# Patient Record
Sex: Female | Born: 1985 | Race: White | Hispanic: No | Marital: Married | Smoking: Never smoker
Health system: Southern US, Community
[De-identification: ages and names within clinical notes are randomized; demographics above are authoritative.]

## PROBLEM LIST (undated history)

## (undated) ENCOUNTER — Inpatient Hospital Stay (HOSPITAL_COMMUNITY): Payer: Self-pay

## (undated) DIAGNOSIS — Z789 Other specified health status: Secondary | ICD-10-CM

## (undated) HISTORY — PX: APPENDECTOMY: SHX54

---

## 2009-05-23 ENCOUNTER — Other Ambulatory Visit: Payer: Self-pay | Admitting: Emergency Medicine

## 2009-05-24 ENCOUNTER — Inpatient Hospital Stay (HOSPITAL_COMMUNITY): Admission: AD | Admit: 2009-05-24 | Discharge: 2009-05-24 | Payer: Self-pay | Admitting: Obstetrics & Gynecology

## 2009-05-24 ENCOUNTER — Other Ambulatory Visit: Payer: Self-pay | Admitting: Emergency Medicine

## 2009-05-26 ENCOUNTER — Ambulatory Visit (HOSPITAL_COMMUNITY): Admission: RE | Admit: 2009-05-26 | Discharge: 2009-05-26 | Payer: Self-pay | Admitting: Obstetrics & Gynecology

## 2009-07-01 ENCOUNTER — Emergency Department (HOSPITAL_BASED_OUTPATIENT_CLINIC_OR_DEPARTMENT_OTHER): Admission: EM | Admit: 2009-07-01 | Discharge: 2009-07-01 | Payer: Self-pay | Admitting: Emergency Medicine

## 2009-08-17 ENCOUNTER — Other Ambulatory Visit: Payer: Self-pay | Admitting: Emergency Medicine

## 2009-08-18 ENCOUNTER — Inpatient Hospital Stay (HOSPITAL_COMMUNITY): Admission: AD | Admit: 2009-08-18 | Discharge: 2009-08-18 | Payer: Self-pay | Admitting: Obstetrics & Gynecology

## 2009-08-18 ENCOUNTER — Other Ambulatory Visit: Payer: Self-pay | Admitting: Emergency Medicine

## 2010-12-06 LAB — CULTURE, ROUTINE-GENITAL: Culture: NORMAL

## 2010-12-06 LAB — DIFFERENTIAL
Lymphocytes Relative: 18 % (ref 12–46)
Monocytes Absolute: 0.9 10*3/uL (ref 0.1–1.0)
Monocytes Relative: 7 % (ref 3–12)
Neutro Abs: 8.8 10*3/uL — ABNORMAL HIGH (ref 1.7–7.7)

## 2010-12-06 LAB — BASIC METABOLIC PANEL
CO2: 26 mEq/L (ref 19–32)
Calcium: 9.5 mg/dL (ref 8.4–10.5)
GFR calc Af Amer: >60 mL/min (ref >60)
GFR calc non Af Amer: >60 mL/min (ref >60)
Sodium: 147 mEq/L — ABNORMAL HIGH (ref 135–145)

## 2010-12-06 LAB — CBC
Hemoglobin: 12.2 g/dL (ref 12.0–15.0)
MCHC: 35 g/dL (ref 30.0–36.0)
RBC: 4.17 MIL/uL (ref 3.87–5.11)

## 2010-12-06 LAB — GRAM STAIN: Gram Stain: NONE SEEN

## 2010-12-08 LAB — URINALYSIS, ROUTINE W REFLEX MICROSCOPIC
Bilirubin Urine: NEGATIVE
Hgb urine dipstick: NEGATIVE
Specific Gravity, Urine: 1.013 (ref 1.005–1.030)
pH: 6 (ref 5.0–8.0)

## 2010-12-09 LAB — CBC
HCT: 35.6 % — ABNORMAL LOW (ref 36.0–46.0)
MCHC: 35 g/dL (ref 30.0–36.0)
Platelets: 393 10*3/uL (ref 150–400)
RDW: 13.4 % (ref 11.5–15.5)

## 2010-12-09 LAB — HCG, QUANTITATIVE, PREGNANCY
hCG, Beta Chain, Quant, S: 273 m[IU]/mL — ABNORMAL HIGH (ref <5)
hCG, Beta Chain, Quant, S: 68 m[IU]/mL — ABNORMAL HIGH (ref <5)

## 2010-12-09 LAB — URINALYSIS, ROUTINE W REFLEX MICROSCOPIC
Glucose, UA: NEGATIVE mg/dL
Nitrite: NEGATIVE
Protein, ur: NEGATIVE mg/dL
pH: 7 (ref 5.0–8.0)

## 2010-12-09 LAB — URINE MICROSCOPIC-ADD ON

## 2010-12-09 LAB — GC/CHLAMYDIA PROBE AMP, GENITAL: Chlamydia, DNA Probe: NEGATIVE

## 2010-12-09 LAB — DIFFERENTIAL
Basophils Absolute: 0.1 10*3/uL (ref 0.0–0.1)
Basophils Relative: 1 % (ref 0–1)
Eosinophils Absolute: 0 10*3/uL (ref 0.0–0.7)
Eosinophils Relative: 0 % (ref 0–5)
Lymphocytes Relative: 16 % (ref 12–46)

## 2010-12-09 LAB — WET PREP, GENITAL: Trich, Wet Prep: NONE SEEN

## 2012-05-02 ENCOUNTER — Inpatient Hospital Stay (HOSPITAL_COMMUNITY)
Admission: AD | Admit: 2012-05-02 | Discharge: 2012-05-02 | Disposition: A | Discharge status: Hospice | Payer: Federal, State, Local not specified - PPO | Source: Ambulatory Visit | Attending: Obstetrics and Gynecology | Admitting: Obstetrics and Gynecology

## 2012-05-02 ENCOUNTER — Encounter (HOSPITAL_COMMUNITY): Payer: Self-pay

## 2012-05-02 DIAGNOSIS — O210 Mild hyperemesis gravidarum: Secondary | ICD-10-CM | POA: Insufficient documentation

## 2012-05-02 DIAGNOSIS — O219 Vomiting of pregnancy, unspecified: Secondary | ICD-10-CM

## 2012-05-02 HISTORY — DX: Other specified health status: Z78.9

## 2012-05-02 LAB — URINE MICROSCOPIC-ADD ON

## 2012-05-02 LAB — URINALYSIS, ROUTINE W REFLEX MICROSCOPIC
Glucose, UA: NEGATIVE mg/dL
Specific Gravity, Urine: >1.03 — ABNORMAL HIGH (ref 1.005–1.030)
pH: 6 (ref 5.0–8.0)

## 2012-05-02 MED ORDER — METOCLOPRAMIDE HCL 10 MG PO TABS: 10.0000 mg | ORAL_TABLET | Freq: Four times a day (QID) | ORAL | Status: DC

## 2012-05-02 MED ORDER — RANITIDINE HCL 150 MG PO TABS: 150.0000 mg | ORAL_TABLET | Freq: Two times a day (BID) | ORAL | Status: DC

## 2012-05-02 MED ORDER — DEXTROSE 5 % IN LACTATED RINGERS IV BOLUS
1000.0000 mL | Freq: Once | INTRAVENOUS | Status: AC
  Administered 2012-05-02: 1000 mL via INTRAVENOUS

## 2012-05-02 MED ORDER — ONDANSETRON HCL 8 MG PO TABS: 4.0000 mg | ORAL_TABLET | Freq: Three times a day (TID) | ORAL | Status: AC | PRN

## 2012-05-02 MED ORDER — ONDANSETRON HCL 4 MG/2ML IJ SOLN
4.0000 mg | Freq: Once | INTRAMUSCULAR | Status: AC
  Administered 2012-05-02: 4 mg via INTRAVENOUS
  Filled 2012-05-02: qty 2

## 2012-05-02 MED ORDER — FAMOTIDINE IN NACL 20-0.9 MG/50ML-% IV SOLN
20.0000 mg | Freq: Once | INTRAVENOUS | Status: AC
Start: 2012-05-02 — End: 2012-05-02
  Administered 2012-05-02: 20 mg via INTRAVENOUS
  Filled 2012-05-02: qty 50

## 2012-05-02 NOTE — MAU Provider Note (Signed)
History     CSN: [id number]  Arrival date and time: 05/02/12 1641   None     Chief Complaint  Patient presents with  . Morning Sickness   HPI 26 y.o. [id number] at [redacted]w[redacted]d with ongoing nausea and vomiting, worse x 2 days, taking Zofran ODT and Phenergan at home, but taste of Zofran makes her sick and can't keep down phenergan.  Past Medical History  Diagnosis Date  . No pertinent past medical history     Past Surgical History  Procedure Date  . Cesarean section     Family History  Problem Relation Age of Onset  . Other Neg Hx     History  Substance Use Topics  . Smoking status: Never Smoker   . Smokeless tobacco: Not on file  . Alcohol Use: No    Allergies: No Known Allergies  Prescriptions prior to admission  Medication Sig Dispense Refill  . calcium carbonate (TUMS - DOSED IN MG ELEMENTAL CALCIUM) 500 MG chewable tablet Chew 2 tablets by mouth as needed. For heartburn      . docusate sodium (COLACE) 100 MG capsule Take 100 mg by mouth 2 (two) times daily as needed. For constipation      . flintstones complete (FLINTSTONES) 60 MG chewable tablet Chew 2 tablets by mouth daily.      . nitrofurantoin, macrocrystal-monohydrate, (MACROBID) 100 MG capsule Take 100 mg by mouth 2 (two) times daily. For 7 days starting 04/30/12      . ondansetron (ZOFRAN-ODT) 8 MG disintegrating tablet Take 8 mg by mouth every 8 (eight) hours as needed. For nausea      . promethazine (PHENERGAN) 25 MG tablet Take 25 mg by mouth every 6 (six) hours as needed. For nausea        Review of Systems  Constitutional: Negative.   Respiratory: Negative.   Cardiovascular: Negative.   Gastrointestinal: Positive for heartburn, nausea and vomiting. Negative for abdominal pain, diarrhea and constipation.  Genitourinary: Negative for dysuria, urgency, frequency, hematuria and flank pain.       Negative for vaginal bleeding, vaginal discharge, dyspareunia  Musculoskeletal: Negative.   Neurological:  Negative.   Psychiatric/Behavioral: Negative.    Physical Exam   Blood pressure 136/67, pulse 104, temperature 98.4 F (36.9 C), temperature source Oral, resp. rate 16, height 5\' 1"  (1.549 m), weight 173 lb 3.2 oz (78.563 kg), SpO2 100.00%.  Physical Exam  Nursing note and vitals reviewed. Constitutional: She is oriented to person, place, and time. She appears well-developed and well-nourished. No distress.  Cardiovascular: Normal rate.   Respiratory: Effort normal.  Musculoskeletal: Normal range of motion.  Neurological: She is alert and oriented to person, place, and time.  Skin: Skin is warm and dry.  Psychiatric: She has a normal mood and affect.    MAU Course  Procedures  Results for orders placed during the hospital encounter of 05/02/12 (from the past 24 hour(s))  URINALYSIS, ROUTINE W REFLEX MICROSCOPIC     Status: Abnormal   Collection Time   05/02/12  4:59 PM      Component Value Range   Color, Urine YELLOW  YELLOW   APPearance CLOUDY (*) CLEAR   Specific Gravity, Urine >1.030 (*) 1.005 - 1.030   pH 6.0  5.0 - 8.0   Glucose, UA NEGATIVE  NEGATIVE mg/dL   Hgb urine dipstick SMALL (*) NEGATIVE   Bilirubin Urine SMALL (*) NEGATIVE   Ketones, ur 40 (*) NEGATIVE mg/dL   Protein, ur NEGATIVE  NEGATIVE  mg/dL   Urobilinogen, UA 0.2  0.0 - 1.0 mg/dL   Nitrite NEGATIVE  NEGATIVE   Leukocytes, UA MODERATE (*) NEGATIVE  URINE MICROSCOPIC-ADD ON     Status: Abnormal   Collection Time   05/02/12  4:59 PM      Component Value Range   Squamous Epithelial / LPF MANY (*) RARE   WBC, UA 21-50  <3 WBC/hpf   RBC / HPF 0-2  <3 RBC/hpf   Bacteria, UA FEW (*) RARE   Urine-Other MUCOUS PRESENT     IV hydration, Zofran 4 mg IV and Pepcid 20 mg IV  Assessment and Plan  26 y.o. [id number] at [redacted]w[redacted]d 1. Pregnancy related nausea and vomiting, antepartum    Medication List  As of 05/04/2012  2:43 PM   START taking these medications         metoCLOPramide 10 MG tablet   Commonly known as:  REGLAN   Take 1 tablet (10 mg total) by mouth 4 (four) times daily.      ondansetron 8 MG tablet   Commonly known as: ZOFRAN   Take 0.5-1 tablets (4-8 mg total) by mouth every 8 (eight) hours as needed for nausea.      ranitidine 150 MG tablet   Commonly known as: ZANTAC   Take 1 tablet (150 mg total) by mouth 2 (two) times daily.         CONTINUE taking these medications         calcium carbonate 500 MG chewable tablet   Commonly known as: TUMS - dosed in mg elemental calcium      docusate sodium 100 MG capsule   Commonly known as: COLACE      flintstones complete 60 MG chewable tablet      nitrofurantoin (macrocrystal-monohydrate) 100 MG capsule   Commonly known as: MACROBID      promethazine 25 MG tablet   Commonly known as: PHENERGAN         STOP taking these medications         ondansetron 8 MG disintegrating tablet          Where to get your medications    These are the prescriptions that you need to pick up. We sent them to a specific pharmacy, so you will need to go there to get them.   [id number] - [place], [place] - [address]    [address] [place] [address]    Phone: [phone]        metoCLOPramide 10 MG tablet   ondansetron 8 MG tablet   ranitidine 150 MG tablet              Kelsey Rogers 05/02/2012, 6:03 PM

## 2012-05-02 NOTE — MAU Note (Signed)
Patient states she has had nausea for a while but has not kept anything down since yesterday. Has slight back pain with vomiting, no bleeding.

## 2012-05-07 LAB — OB RESULTS CONSOLE GC/CHLAMYDIA: Chlamydia: NEGATIVE

## 2012-05-10 ENCOUNTER — Inpatient Hospital Stay (HOSPITAL_COMMUNITY)
Admission: AD | Admit: 2012-05-10 | Discharge: 2012-05-10 | Disposition: A | Discharge status: Home / Self Care | Payer: Federal, State, Local not specified - PPO | Source: Ambulatory Visit | Attending: Obstetrics and Gynecology | Admitting: Obstetrics and Gynecology

## 2012-05-10 ENCOUNTER — Encounter (HOSPITAL_COMMUNITY): Payer: Self-pay | Admitting: *Deleted

## 2012-05-10 DIAGNOSIS — R12 Heartburn: Secondary | ICD-10-CM | POA: Insufficient documentation

## 2012-05-10 DIAGNOSIS — E860 Dehydration: Secondary | ICD-10-CM | POA: Insufficient documentation

## 2012-05-10 DIAGNOSIS — O211 Hyperemesis gravidarum with metabolic disturbance: Secondary | ICD-10-CM

## 2012-05-10 LAB — URINALYSIS, ROUTINE W REFLEX MICROSCOPIC
Glucose, UA: NEGATIVE mg/dL
Ketones, ur: 40 mg/dL — AB
Nitrite: NEGATIVE
Protein, ur: 30 mg/dL — AB
Urobilinogen, UA: 0.2 mg/dL (ref 0.0–1.0)

## 2012-05-10 MED ORDER — FAMOTIDINE IN NACL 20-0.9 MG/50ML-% IV SOLN
20.0000 mg | Freq: Once | INTRAVENOUS | Status: AC
  Administered 2012-05-10: 20 mg via INTRAVENOUS
  Filled 2012-05-10: qty 50

## 2012-05-10 MED ORDER — OMEPRAZOLE 20 MG PO CPDR: 20.0000 mg | DELAYED_RELEASE_CAPSULE | Freq: Every day | ORAL | Status: DC

## 2012-05-10 MED ORDER — LACTATED RINGERS IV BOLUS (SEPSIS)
1000.0000 mL | Freq: Once | INTRAVENOUS | Status: AC
  Administered 2012-05-10: 1000 mL via INTRAVENOUS

## 2012-05-10 MED ORDER — ONDANSETRON HCL 4 MG/2ML IJ SOLN
4.0000 mg | Freq: Once | INTRAMUSCULAR | Status: AC
  Administered 2012-05-10: 4 mg via INTRAVENOUS
  Filled 2012-05-10: qty 2

## 2012-05-10 NOTE — MAU Note (Signed)
Pt states she feels like her nausea medicine is not working and that she needs fluids

## 2012-05-10 NOTE — MAU Provider Note (Signed)
Chief Complaint: Hyperemesis Gravidarum  Initial contact at 1920   SUBJECTIVE HPI: Kelsey Rogers is a 26 y.o. [phone] at [redacted]w[redacted]d by LMP who presents to MAU reporting intractable vomiting unable to retain food or po meds for over 24 hrs. Vomited x10 today and now nauseated and retching. Also with "raw" throat and heartburn. Has had Reglan, Zofran, Phenergan and got most relief from IV Zofran at MAU visit last week. No fever or urinary complaints.   Past Medical History  Diagnosis Date  . No pertinent past medical history    OB History    Grav Para Term Preterm Abortions TAB SAB Ect Mult Living   4 1 0 1 2 0 2 0 0 1      # Outc Date GA Lbr Len/2nd Wgt Sex Del Anes PTL Lv   1 PRE     M LTCS   Yes   Comments: fetal distress   2 SAB            3 SAB            4 CUR              Past Surgical History  Procedure Date  . Cesarean section    History   Social History  . Marital Status: Married    Spouse Name: N/A    Number of Children: N/A  . Years of Education: N/A   Occupational History  . Not on file.   Social History Main Topics  . Smoking status: Never Smoker   . Smokeless tobacco: Not on file  . Alcohol Use: No  . Drug Use: No  . Sexually Active: Yes   Other Topics Concern  . Not on file   Social History Narrative  . No narrative on file   No current facility-administered medications on file prior to encounter.   Current Outpatient Prescriptions on File Prior to Encounter  Medication Sig Dispense Refill  . docusate sodium (COLACE) 100 MG capsule Take 100 mg by mouth 2 (two) times daily as needed. For constipation      . flintstones complete (FLINTSTONES) 60 MG chewable tablet Chew 2 tablets by mouth daily.      . metoCLOPramide (REGLAN) 10 MG tablet Take 1 tablet (10 mg total) by mouth 4 (four) times daily.  60 tablet  1  . promethazine (PHENERGAN) 25 MG tablet Take 25 mg by mouth every 6 (six) hours as needed. For nausea      . ranitidine (ZANTAC) 150 MG tablet  Take 1 tablet (150 mg total) by mouth 2 (two) times daily.  60 tablet  1  . ondansetron (ZOFRAN) 8 MG tablet Take 0.5-1 tablets (4-8 mg total) by mouth every 8 (eight) hours as needed for nausea.  20 tablet  1   No Known Allergies  ROS: Pertinent items in HPI  OBJECTIVE Blood pressure 117/74, pulse 93, temperature 97.9 F (36.6 C), temperature source Oral, resp. rate 20, height 5\' 1"  (1.549 m), weight 78.019 kg (172 lb), SpO2 100.00%.   Weight in MAU 1 wk ago 172  GENERAL: Well-developed, well-nourished female in mild distress.  HEENT: Normocephalic HEART: normal rate RESP: normal effort ABDOMEN: Soft, non-tender EXTREMITIES: Nontender, no edema NEURO: Alert and oriented  LAB RESULTS Results for orders placed during the hospital encounter of 05/10/12 (from the past 24 hour(s))  URINALYSIS, ROUTINE W REFLEX MICROSCOPIC     Status: Abnormal   Collection Time   05/10/12  6:45 PM  Component Value Range   Color, Urine YELLOW  YELLOW   APPearance CLOUDY (*) CLEAR   Specific Gravity, Urine >1.030 (*) 1.005 - 1.030   pH 6.5  5.0 - 8.0   Glucose, UA NEGATIVE  NEGATIVE mg/dL   Hgb urine dipstick LARGE (*) NEGATIVE   Bilirubin Urine NEGATIVE  NEGATIVE   Ketones, ur 40 (*) NEGATIVE mg/dL   Protein, ur 30 (*) NEGATIVE mg/dL   Urobilinogen, UA 0.2  0.0 - 1.0 mg/dL   Nitrite NEGATIVE  NEGATIVE   Leukocytes, UA LARGE (*) NEGATIVE  URINE MICROSCOPIC-ADD ON     Status: Abnormal   Collection Time   05/10/12  6:45 PM      Component Value Range   Squamous Epithelial / LPF MANY (*) RARE   WBC, UA TOO NUMEROUS TO COUNT  <3 WBC/hpf   RBC / HPF 7-10  <3 RBC/hpf   Urine-Other MUCOUS PRESENT       MAU COURSE Rehydration with LR 1000, Zofran 4mg  IV, Pepcid 20mg  IV  ASSESSMENT 1. Hyperemesis gravidarum with dehydration   2. Pyrosis   [id number] at [redacted]w[redacted]d  PLAN Feeling much better after IVF with Zofran and Pepcid.  Will go home.   Taking zantac but not helping with heartburn.  Will try  Prilosec (stop zantac) and BRAT diet also. Medication List  As of 05/10/2012  7:24 PM   ASK your doctor about these medications         docusate sodium 100 MG capsule   Commonly known as: COLACE   Take 100 mg by mouth 2 (two) times daily as needed. For constipation      flintstones complete 60 MG chewable tablet   Chew 2 tablets by mouth daily.      metoCLOPramide 10 MG tablet   Commonly known as: REGLAN   Take 1 tablet (10 mg total) by mouth 4 (four) times daily.      ondansetron 8 MG tablet   Commonly known as: ZOFRAN   Take 8 mg by mouth every 8 (eight) hours as needed. For nausea      promethazine 25 MG tablet   Commonly known as: PHENERGAN   Take 25 mg by mouth every 6 (six) hours as needed. For nausea      ranitidine 150 MG tablet   Commonly known as: ZANTAC   Take 1 tablet (150 mg total) by mouth 2 (two) times daily.             Danae Orleans, CNM 05/10/2012  7:24 PM  Care assumed by Lilyan Punt, NP at [facility]

## 2012-05-22 ENCOUNTER — Encounter (HOSPITAL_COMMUNITY): Payer: Self-pay | Admitting: Family

## 2012-05-22 ENCOUNTER — Inpatient Hospital Stay (HOSPITAL_COMMUNITY)
Admission: AD | Admit: 2012-05-22 | Discharge: 2012-05-22 | Disposition: A | Discharge status: Home / Self Care | Payer: Federal, State, Local not specified - PPO | Source: Ambulatory Visit | Attending: Obstetrics and Gynecology | Admitting: Obstetrics and Gynecology

## 2012-05-22 DIAGNOSIS — O219 Vomiting of pregnancy, unspecified: Secondary | ICD-10-CM

## 2012-05-22 DIAGNOSIS — O99891 Other specified diseases and conditions complicating pregnancy: Secondary | ICD-10-CM | POA: Insufficient documentation

## 2012-05-22 DIAGNOSIS — K2289 Other specified disease of esophagus: Secondary | ICD-10-CM

## 2012-05-22 DIAGNOSIS — K228 Other specified diseases of esophagus: Secondary | ICD-10-CM

## 2012-05-22 DIAGNOSIS — O210 Mild hyperemesis gravidarum: Secondary | ICD-10-CM | POA: Insufficient documentation

## 2012-05-22 DIAGNOSIS — R51 Headache: Secondary | ICD-10-CM | POA: Insufficient documentation

## 2012-05-22 DIAGNOSIS — R109 Unspecified abdominal pain: Secondary | ICD-10-CM | POA: Insufficient documentation

## 2012-05-22 LAB — URINALYSIS, ROUTINE W REFLEX MICROSCOPIC
Glucose, UA: NEGATIVE mg/dL
Hgb urine dipstick: NEGATIVE
Specific Gravity, Urine: 1.025 (ref 1.005–1.030)
Urobilinogen, UA: 2 mg/dL — ABNORMAL HIGH (ref 0.0–1.0)
pH: 6 (ref 5.0–8.0)

## 2012-05-22 LAB — URINE MICROSCOPIC-ADD ON

## 2012-05-22 MED ORDER — ONDANSETRON HCL 4 MG/2ML IJ SOLN
4.0000 mg | Freq: Once | INTRAMUSCULAR | Status: AC
  Administered 2012-05-22: 4 mg via INTRAVENOUS
  Filled 2012-05-22: qty 2

## 2012-05-22 MED ORDER — LACTATED RINGERS IV BOLUS (SEPSIS)
1000.0000 mL | Freq: Once | INTRAVENOUS | Status: AC
  Administered 2012-05-22: 1000 mL via INTRAVENOUS

## 2012-05-22 MED ORDER — PROMETHAZINE HCL 25 MG/ML IJ SOLN
25.0000 mg | Freq: Once | INTRAVENOUS | Status: AC
  Administered 2012-05-22: 25 mg via INTRAVENOUS
  Filled 2012-05-22: qty 1

## 2012-05-22 MED ORDER — FAMOTIDINE IN NACL 20-0.9 MG/50ML-% IV SOLN
20.0000 mg | Freq: Once | INTRAVENOUS | Status: DC
  Filled 2012-05-22: qty 50

## 2012-05-22 MED ORDER — GI COCKTAIL ~~LOC~~
30.0000 mL | Freq: Once | ORAL | Status: AC
  Administered 2012-05-22: 30 mL via ORAL
  Filled 2012-05-22: qty 30

## 2012-05-22 NOTE — MAU Note (Signed)
Patient states she has been having vomiting. Reglan, Phenergan and Zofran are no longer working. States she vomited blood last night and a little blood in the last vomit. States she has been having headaches and right side pain. Throat burning.

## 2012-05-22 NOTE — MAU Provider Note (Signed)
Chief Complaint:  Emesis, Abdominal Pain and Headache    First Provider Initiated Contact with Patient 05/22/12 1738      Kelsey Rogers is  26 y.o. [phone].  No LMP recorded. Patient is pregnant..  Her pregnancy status is positive.  She presents complaining of Emesis, Abdominal Pain and Headache . Onset is described as ongoing and worsening vomiting today. Reports scant blood in emesis this afternoon. Called the office and was instructed to come to MAU.   Obstetrical/Gynecological History: OB History    Grav Para Term Preterm Abortions TAB SAB Ect Mult Living   4 1 0 1 2 0 2 0 0 1       Past Medical History: Past Medical History  Diagnosis Date  . No pertinent past medical history     Past Surgical History: Past Surgical History  Procedure Date  . Cesarean section     Family History: Family History  Problem Relation Age of Onset  . Other Neg Hx     Social History: History  Substance Use Topics  . Smoking status: Never Smoker   . Smokeless tobacco: Not on file  . Alcohol Use: No    Allergies: No Known Allergies  Prescriptions prior to admission  Medication Sig Dispense Refill  . docusate sodium (COLACE) 100 MG capsule Take 100 mg by mouth 2 (two) times daily as needed. For constipation      . flintstones complete (FLINTSTONES) 60 MG chewable tablet Chew 2 tablets by mouth daily.      . metoCLOPramide (REGLAN) 10 MG tablet Take 10 mg by mouth 4 (four) times daily.      Marland Kitchen omeprazole (PRILOSEC) 20 MG capsule Take 1 capsule (20 mg total) by mouth daily. Do not chew  30 capsule  0  . ondansetron (ZOFRAN) 8 MG tablet Take 8 mg by mouth every 8 (eight) hours as needed. For nausea      . promethazine (PHENERGAN) 25 MG tablet Take 25 mg by mouth every 6 (six) hours as needed. For nausea        Review of Systems - History obtained from the patient General ROS: positive for  - vomiting Respiratory ROS: no cough, shortness of breath, or wheezing Cardiovascular ROS: no  chest pain or dyspnea on exertion Gastrointestinal ROS: no abdominal pain, change in bowel habits, or black or bloody stools positive for - nausea/vomiting Genito-Urinary ROS: no dysuria, trouble voiding, or hematuria negative for - vulvar/vaginal symptoms  Physical Exam   Blood pressure 105/55, pulse 87, temperature 98.4 F (36.9 C), temperature source Oral, resp. rate 16, height 5\' 2"  (1.575 m), weight 170 lb 6.4 oz (77.293 kg), SpO2 100.00%.  General: General appearance - alert, well appearing, and in no distress, oriented to person, place, and time and overweight Mental status - alert, oriented to person, place, and time, normal mood, behavior, speech, dress, motor activity, and thought processes, affect appropriate to mood Abdomen - soft, nontender, nondistended, no masses or organomegaly Focused Gynecological Exam: examination not indicated  Labs: Recent Results (from the past 24 hour(s))  URINALYSIS, ROUTINE W REFLEX MICROSCOPIC   Collection Time   05/22/12  4:58 PM      Component Value Range   Color, Urine YELLOW  YELLOW   APPearance CLOUDY (*) CLEAR   Specific Gravity, Urine 1.025  1.005 - 1.030   pH 6.0  5.0 - 8.0   Glucose, UA NEGATIVE  NEGATIVE mg/dL   Hgb urine dipstick NEGATIVE  NEGATIVE   Bilirubin Urine SMALL (*)  NEGATIVE   Ketones, ur >80 (*) NEGATIVE mg/dL   Protein, ur NEGATIVE  NEGATIVE mg/dL   Urobilinogen, UA 2.0 (*) 0.0 - 1.0 mg/dL   Nitrite NEGATIVE  NEGATIVE   Leukocytes, UA SMALL (*) NEGATIVE  URINE MICROSCOPIC-ADD ON   Collection Time   05/22/12  4:58 PM      Component Value Range   Squamous Epithelial / LPF MANY (*) RARE   WBC, UA 7-10  <3 WBC/hpf   Bacteria, UA FEW (*) RARE   Urine-Other MUCOUS PRESENT     ED Course: IVFs, antiemetics, pepcid, GI cocktail.  7:41 PM Pt reports feeling better after 1/2 of first bag.   MD Consult: Discussed with Dr. Renaldo Fiddler, agrees with plan.  Assessment: Nausea/Vomiting of pregnancy Esophogeal irritation from  vomiting  Plan: Discharge home after completion of IVFs and meds FU in office as scheduled.  [name] E. 05/22/2012,7:36 PM

## 2012-05-22 NOTE — MAU Note (Signed)
Pt states she started throwing up stomach acid and blood ("a half dollar amount")at 1445 today.

## 2012-05-28 ENCOUNTER — Inpatient Hospital Stay (HOSPITAL_COMMUNITY)
Admission: AD | Admit: 2012-05-28 | Discharge: 2012-05-29 | Disposition: A | Discharge status: Home / Self Care | Payer: Federal, State, Local not specified - PPO | Source: Ambulatory Visit | Attending: Obstetrics and Gynecology | Admitting: Obstetrics and Gynecology

## 2012-05-28 ENCOUNTER — Encounter (HOSPITAL_COMMUNITY): Payer: Self-pay | Admitting: *Deleted

## 2012-05-28 DIAGNOSIS — O219 Vomiting of pregnancy, unspecified: Secondary | ICD-10-CM

## 2012-05-28 DIAGNOSIS — O210 Mild hyperemesis gravidarum: Secondary | ICD-10-CM | POA: Insufficient documentation

## 2012-05-28 DIAGNOSIS — K219 Gastro-esophageal reflux disease without esophagitis: Secondary | ICD-10-CM

## 2012-05-28 LAB — URINALYSIS, ROUTINE W REFLEX MICROSCOPIC
Glucose, UA: NEGATIVE mg/dL
Ketones, ur: 40 mg/dL — AB
Protein, ur: 30 mg/dL — AB

## 2012-05-28 LAB — URINE MICROSCOPIC-ADD ON

## 2012-05-28 MED ORDER — PROMETHAZINE HCL 25 MG/ML IJ SOLN
25.0000 mg | Freq: Once | INTRAVENOUS | Status: DC
  Filled 2012-05-28: qty 1

## 2012-05-28 MED ORDER — ONDANSETRON HCL 4 MG/2ML IJ SOLN
4.0000 mg | INTRAMUSCULAR | Status: AC
  Administered 2012-05-29: 4 mg via INTRAVENOUS
  Filled 2012-05-28: qty 2

## 2012-05-28 MED ORDER — FAMOTIDINE IN NACL 20-0.9 MG/50ML-% IV SOLN
20.0000 mg | INTRAVENOUS | Status: AC
  Administered 2012-05-29: 20 mg via INTRAVENOUS
  Filled 2012-05-28: qty 50

## 2012-05-28 MED ORDER — DEXTROSE 5 % IN LACTATED RINGERS IV BOLUS
1000.0000 mL | Freq: Once | INTRAVENOUS | Status: AC
  Administered 2012-05-29: 1000 mL via INTRAVENOUS

## 2012-05-28 NOTE — MAU Note (Signed)
Pt G4 P1 at 13.1wks with N/V, unable to keep anything down.  Taking zofran, phenergan, reglan and zantac- no relief.

## 2012-05-28 NOTE — MAU Provider Note (Signed)
Chief Complaint: Emesis During Pregnancy   First Provider Initiated Contact with Patient 05/28/12 2321     SUBJECTIVE HPI: Kelsey Rogers is a 26 y.o. [id number] at [redacted]w[redacted]d by LMP who presents to maternity admissions reporting nausea/vomiting.  She has been unable to keep any food or medications down for 1-2 days.  She has had this problem on and off during the pregnancy.  It will improve for a week or two, then will flare up again and has been difficult to treat.  She cannot tolerate phenergan suppositories, as they cause irritation, and she reports dissolvable Zofran makes her vomit.  She takes PO phenergan, Zofran, Reglan, and Pepcid daily but has been unable to keep any of these down today.  She did place a 25 mg tablet of Phenergan in her vagina a few minutes before leaving home.  She denies LOF, vaginal bleeding, vaginal itching/burning, urinary symptoms, h/a, dizziness, or fever/chills.     History reviewed. No pertinent past medical history. Past Surgical History  Procedure Date  . Cesarean section    History   Social History  . Marital Status: Married    Spouse Name: N/A    Number of Children: N/A  . Years of Education: N/A   Occupational History  . Not on file.   Social History Main Topics  . Smoking status: Never Smoker   . Smokeless tobacco: Not on file  . Alcohol Use: No  . Drug Use: No  . Sexually Active: Yes   Other Topics Concern  . Not on file   Social History Narrative  . No narrative on file   No current facility-administered medications on file prior to encounter.   Current Outpatient Prescriptions on File Prior to Encounter  Medication Sig Dispense Refill  . docusate sodium (COLACE) 100 MG capsule Take 100 mg by mouth 2 (two) times daily as needed. For constipation      . flintstones complete (FLINTSTONES) 60 MG chewable tablet Chew 2 tablets by mouth daily.      . metoCLOPramide (REGLAN) 10 MG tablet Take 10 mg by mouth 4 (four) times daily.      Marland Kitchen  omeprazole (PRILOSEC) 20 MG capsule Take 1 capsule (20 mg total) by mouth daily. Do not chew  30 capsule  0  . ondansetron (ZOFRAN) 8 MG tablet Take 8 mg by mouth every 8 (eight) hours as needed. For nausea      . promethazine (PHENERGAN) 25 MG tablet Take 25 mg by mouth every 6 (six) hours as needed. For nausea       No Known Allergies  ROS: Pertinent items in HPI  OBJECTIVE Blood pressure 107/91, pulse 126, temperature 97.6 F (36.4 C), temperature source Oral, resp. rate 16, height 5' (1.524 m), weight 76.749 kg (169 lb 3.2 oz). GENERAL: Well-developed, well-nourished female in no acute distress.  HEENT: Normocephalic HEART: normal rate RESP: normal effort ABDOMEN: Soft, non-tender EXTREMITIES: Nontender, no edema NEURO: Alert and oriented SPECULUM EXAM: Deferred  FHT 160 by doppler  LAB RESULTS Results for orders placed during the hospital encounter of 05/28/12 (from the past 24 hour(s))  URINALYSIS, ROUTINE W REFLEX MICROSCOPIC     Status: Abnormal   Collection Time   05/28/12 10:45 PM      Component Value Range   Color, Urine YELLOW  YELLOW   APPearance CLEAR  CLEAR   Specific Gravity, Urine >1.030 (*) 1.005 - 1.030   pH 6.0  5.0 - 8.0   Glucose, UA NEGATIVE  NEGATIVE  mg/dL   Hgb urine dipstick NEGATIVE  NEGATIVE   Bilirubin Urine SMALL (*) NEGATIVE   Ketones, ur 40 (*) NEGATIVE mg/dL   Protein, ur 30 (*) NEGATIVE mg/dL   Urobilinogen, UA 1.0  0.0 - 1.0 mg/dL   Nitrite NEGATIVE  NEGATIVE   Leukocytes, UA TRACE (*) NEGATIVE  URINE MICROSCOPIC-ADD ON     Status: Abnormal   Collection Time   05/28/12 10:45 PM      Component Value Range   Squamous Epithelial / LPF MANY (*) RARE   WBC, UA 3-6  <3 WBC/hpf   RBC / HPF 0-2  <3 RBC/hpf   Bacteria, UA FEW (*) RARE   Urine-Other MUCOUS PRESENT      ASSESSMENT 1. Nausea/vomiting in pregnancy   2. Acid reflux     PLAN Called Dr Rana Snare to review assessment and findings Discharge home Protonix 40 mg daily Continue  Reglan, Zofran, Phenergan Increase PO fluids F/U with Dr Rana Snare Return to MAU as needed   Sharen Counter Certified Nurse-Midwife 05/28/2012  11:45 PM

## 2012-05-29 DIAGNOSIS — K219 Gastro-esophageal reflux disease without esophagitis: Secondary | ICD-10-CM | POA: Diagnosis present

## 2012-05-29 DIAGNOSIS — O219 Vomiting of pregnancy, unspecified: Secondary | ICD-10-CM

## 2012-05-29 MED ORDER — PANTOPRAZOLE SODIUM 40 MG PO TBEC: 40.0000 mg | DELAYED_RELEASE_TABLET | Freq: Every day | ORAL | Status: DC

## 2012-09-26 ENCOUNTER — Inpatient Hospital Stay (HOSPITAL_COMMUNITY)
Admission: AD | Admit: 2012-09-26 | Discharge: 2012-09-26 | Disposition: A | Discharge status: Home / Self Care | Payer: Federal, State, Local not specified - PPO | Source: Ambulatory Visit | Attending: Obstetrics and Gynecology | Admitting: Obstetrics and Gynecology

## 2012-09-26 ENCOUNTER — Encounter (HOSPITAL_COMMUNITY): Payer: Self-pay | Admitting: *Deleted

## 2012-09-26 DIAGNOSIS — O212 Late vomiting of pregnancy: Secondary | ICD-10-CM | POA: Insufficient documentation

## 2012-09-26 DIAGNOSIS — R197 Diarrhea, unspecified: Secondary | ICD-10-CM | POA: Insufficient documentation

## 2012-09-26 LAB — URINALYSIS, ROUTINE W REFLEX MICROSCOPIC
Hgb urine dipstick: NEGATIVE
Nitrite: NEGATIVE
Specific Gravity, Urine: >1.03 — ABNORMAL HIGH (ref 1.005–1.030)
Urobilinogen, UA: 0.2 mg/dL (ref 0.0–1.0)

## 2012-09-26 LAB — COMPREHENSIVE METABOLIC PANEL
ALT: 10 U/L (ref 0–35)
AST: 16 U/L (ref 0–37)
CO2: 20 mEq/L (ref 19–32)
Chloride: 102 mEq/L (ref 96–112)
GFR calc non Af Amer: >90 mL/min (ref >90)
Potassium: 4 mEq/L (ref 3.5–5.1)
Sodium: 137 mEq/L (ref 135–145)
Total Bilirubin: 0.5 mg/dL (ref 0.3–1.2)

## 2012-09-26 LAB — CBC
MCV: 74.9 fL — ABNORMAL LOW (ref 78.0–100.0)
Platelets: 373 10*3/uL (ref 150–400)
RBC: 4.59 MIL/uL (ref 3.87–5.11)
WBC: 12.2 10*3/uL — ABNORMAL HIGH (ref 4.0–10.5)

## 2012-09-26 MED ORDER — ONDANSETRON HCL 4 MG/2ML IJ SOLN
4.0000 mg | Freq: Once | INTRAMUSCULAR | Status: AC
  Administered 2012-09-26: 4 mg via INTRAVENOUS
  Filled 2012-09-26: qty 2

## 2012-09-26 MED ORDER — FAMOTIDINE IN NACL 20-0.9 MG/50ML-% IV SOLN
20.0000 mg | Freq: Once | INTRAVENOUS | Status: AC
  Administered 2012-09-26: 20 mg via INTRAVENOUS
  Filled 2012-09-26: qty 50

## 2012-09-26 MED ORDER — LACTATED RINGERS IV BOLUS (SEPSIS)
1000.0000 mL | Freq: Once | INTRAVENOUS | Status: AC
  Administered 2012-09-26: 1000 mL via INTRAVENOUS

## 2012-09-26 MED ORDER — M.V.I. ADULT IV INJ
10.0000 mL | Freq: Once | INTRAVENOUS | Status: AC
  Administered 2012-09-26: 10 mL via INTRAVENOUS
  Filled 2012-09-26: qty 10

## 2012-09-26 MED ORDER — ACETAMINOPHEN 325 MG PO TABS
650.0000 mg | ORAL_TABLET | Freq: Once | ORAL | Status: AC
  Administered 2012-09-26: 650 mg via ORAL
  Filled 2012-09-26: qty 2

## 2012-09-26 NOTE — MAU Note (Signed)
Pt reports vomiting and diarrhea x 24 hours, states she has not been able to keep anything down. Denies fever.

## 2012-09-26 NOTE — MAU Provider Note (Signed)
History     CSN: [id number]  Arrival date and time: 09/26/12 8295   First Provider Initiated Contact with Patient 09/26/12 0356      Chief Complaint  Patient presents with  . Emesis  . Diarrhea   HPI Kelsey Rogers is a 27 y.o. female @ [redacted]w[redacted]d gestation who presents to MAU with nausea, vomiting and diarrhea. The symptoms started 24 hours ago. She had Reglan and Zofran without relief. Associated symptoms include chills, aching all over and just feeling bad. She has abdominal pain but only with vomiting. The history was provided by the patient.  OB History    Grav Para Term Preterm Abortions TAB SAB Ect Mult Living   4 1 0 1 2 0 2 0 0 1       History reviewed. No pertinent past medical history.  Past Surgical History  Procedure Date  . Cesarean section     Family History  Problem Relation Age of Onset  . Other Neg Hx     History  Substance Use Topics  . Smoking status: Never Smoker   . Smokeless tobacco: Not on file  . Alcohol Use: No    Allergies: No Known Allergies  Prescriptions prior to admission  Medication Sig Dispense Refill  . diphenhydrAMINE (BENADRYL) 25 MG tablet Take 25 mg by mouth every 6 (six) hours as needed.      . docusate sodium (COLACE) 100 MG capsule Take 100 mg by mouth 2 (two) times daily as needed. For constipation      . flintstones complete (FLINTSTONES) 60 MG chewable tablet Chew 2 tablets by mouth daily.      . metoCLOPramide (REGLAN) 10 MG tablet Take 10 mg by mouth 4 (four) times daily.      . ondansetron (ZOFRAN) 8 MG tablet Take 8 mg by mouth every 8 (eight) hours as needed. For nausea      . pantoprazole (PROTONIX) 40 MG tablet Take 1 tablet (40 mg total) by mouth daily.  30 tablet  5  . promethazine (PHENERGAN) 25 MG tablet Take 25 mg by mouth every 6 (six) hours as needed. For nausea        Review of Systems  Constitutional: Positive for chills and malaise/fatigue. Negative for fever and weight loss.  HENT: Negative for ear pain,  nosebleeds, congestion, sore throat and neck pain.   Eyes: Negative for blurred vision, double vision, photophobia and pain.  Respiratory: Negative for cough, shortness of breath and wheezing.   Cardiovascular: Negative for chest pain, palpitations and leg swelling.  Gastrointestinal: Positive for heartburn, nausea, vomiting, abdominal pain (with vomiting) and diarrhea. Negative for constipation.  Genitourinary: Negative for dysuria, urgency and frequency.  Musculoskeletal: Positive for back pain. Negative for myalgias.  Skin: Negative for itching and rash.  Neurological: Negative for dizziness, sensory change, speech change, seizures, weakness and headaches.  Endo/Heme/Allergies: Does not bruise/bleed easily.  Psychiatric/Behavioral: Negative for depression and substance abuse. The patient is not nervous/anxious and does not have insomnia.    Physical Exam   Blood pressure 105/70, pulse 128, temperature 97.9 F (36.6 C), temperature source Oral, resp. rate 20, height 5\' 1"  (1.549 m), weight 182 lb (82.555 kg), SpO2 99.00%.  Physical Exam  Nursing note and vitals reviewed. Constitutional: She is oriented to person, place, and time. She appears well-developed and well-nourished. No distress.  HENT:  Head: Normocephalic and atraumatic.  Eyes: EOM are normal.  Neck: Neck supple.  Cardiovascular:       tachycardia  Respiratory:  Effort normal.  GI: Soft. There is tenderness in the right upper quadrant, epigastric area and left upper quadrant.    Musculoskeletal: Normal range of motion.  Neurological: She is alert and oriented to person, place, and time.  Skin: Skin is warm and dry.  Psychiatric: She has a normal mood and affect. Her behavior is normal. Judgment and thought content normal.   Results for orders placed during the hospital encounter of 09/26/12 (from the past 24 hour(s))  URINALYSIS, ROUTINE W REFLEX MICROSCOPIC     Status: Abnormal   Collection Time   09/26/12  3:29 AM        Component Value Range   Color, Urine YELLOW  YELLOW   APPearance CLEAR  CLEAR   Specific Gravity, Urine >1.030 (*) 1.005 - 1.030   pH 6.0  5.0 - 8.0   Glucose, UA NEGATIVE  NEGATIVE mg/dL   Hgb urine dipstick NEGATIVE  NEGATIVE   Bilirubin Urine SMALL (*) NEGATIVE   Ketones, ur >80 (*) NEGATIVE mg/dL   Protein, ur 30 (*) NEGATIVE mg/dL   Urobilinogen, UA 0.2  0.0 - 1.0 mg/dL   Nitrite NEGATIVE  NEGATIVE   Leukocytes, UA NEGATIVE  NEGATIVE  URINE MICROSCOPIC-ADD ON     Status: Abnormal   Collection Time   09/26/12  3:29 AM      Component Value Range   Squamous Epithelial / LPF FEW (*) RARE   WBC, UA 0-2  <3 WBC/hpf   Bacteria, UA RARE  RARE   Casts HYALINE CASTS (*) NEGATIVE   Urine-Other MUCOUS PRESENT    CBC     Status: Abnormal   Collection Time   09/26/12  3:57 AM      Component Value Range   WBC 12.2 (*) 4.0 - 10.5 K/uL   RBC 4.59  3.87 - 5.11 MIL/uL   Hemoglobin 10.9 (*) 12.0 - 15.0 g/dL   HCT 11.9 (*) 14.7 - 82.9 %   MCV 74.9 (*) 78.0 - 100.0 fL   MCH 23.7 (*) 26.0 - 34.0 pg   MCHC 31.7  30.0 - 36.0 g/dL   RDW 56.2  13.0 - 86.5 %   Platelets 373  150 - 400 K/uL    Procedures  Assessment: 27 y.o. female @ [redacted]w[redacted]d with nausea, vomiting and diarrhea    Plan:  IV hydration   CBC, CMET   Zofran 4 mg IV, Pepcid 20 mg IVPB, Tylenol 650 mg PO   Discussed with Dr. Henderson Cloud will observe and call him with update  NEESE,HOPE, RN, FNP, [facility] 09/26/2012, 4:40 AM

## 2012-11-06 ENCOUNTER — Encounter (HOSPITAL_COMMUNITY): Payer: Self-pay | Admitting: *Deleted

## 2012-11-06 ENCOUNTER — Observation Stay (HOSPITAL_COMMUNITY)
Admission: AD | Admit: 2012-11-06 | Discharge: 2012-11-07 | Disposition: A | Discharge status: Home / Self Care | Payer: Federal, State, Local not specified - PPO | Source: Ambulatory Visit | Attending: Obstetrics and Gynecology | Admitting: Obstetrics and Gynecology

## 2012-11-06 DIAGNOSIS — Z3689 Encounter for other specified antenatal screening: Secondary | ICD-10-CM

## 2012-11-06 DIAGNOSIS — O139 Gestational [pregnancy-induced] hypertension without significant proteinuria, unspecified trimester: Principal | ICD-10-CM | POA: Insufficient documentation

## 2012-11-06 DIAGNOSIS — R51 Headache: Secondary | ICD-10-CM | POA: Insufficient documentation

## 2012-11-06 DIAGNOSIS — O133 Gestational [pregnancy-induced] hypertension without significant proteinuria, third trimester: Secondary | ICD-10-CM

## 2012-11-06 LAB — COMPREHENSIVE METABOLIC PANEL
ALT: 7 U/L (ref 0–35)
AST: 11 U/L (ref 0–37)
CO2: 20 mEq/L (ref 19–32)
Calcium: 9.2 mg/dL (ref 8.4–10.5)
Chloride: 106 mEq/L (ref 96–112)
Creatinine, Ser: 0.71 mg/dL (ref 0.50–1.10)
GFR calc Af Amer: >90 mL/min (ref >90)
GFR calc non Af Amer: >90 mL/min (ref >90)
Glucose, Bld: 108 mg/dL — ABNORMAL HIGH (ref 70–99)
Total Bilirubin: 0.3 mg/dL (ref 0.3–1.2)

## 2012-11-06 LAB — CBC
Hemoglobin: 9.2 g/dL — ABNORMAL LOW (ref 12.0–15.0)
MCH: 20.8 pg — ABNORMAL LOW (ref 26.0–34.0)
MCV: 68.6 fL — ABNORMAL LOW (ref 78.0–100.0)
Platelets: 367 10*3/uL (ref 150–400)
RBC: 4.42 MIL/uL (ref 3.87–5.11)
WBC: 11.5 10*3/uL — ABNORMAL HIGH (ref 4.0–10.5)

## 2012-11-06 LAB — OB RESULTS CONSOLE RUBELLA ANTIBODY, IGM: Rubella: IMMUNE

## 2012-11-06 LAB — OB RESULTS CONSOLE HIV ANTIBODY (ROUTINE TESTING): HIV: NONREACTIVE

## 2012-11-06 LAB — OB RESULTS CONSOLE RPR: RPR: NONREACTIVE

## 2012-11-06 LAB — OB RESULTS CONSOLE HEPATITIS B SURFACE ANTIGEN: Hepatitis B Surface Ag: NEGATIVE

## 2012-11-06 MED ORDER — PRENATAL MULTIVITAMIN CH
1.0000 | ORAL_TABLET | Freq: Every day | ORAL | Status: DC
  Administered 2012-11-07: 1 via ORAL
  Filled 2012-11-06: qty 1

## 2012-11-06 MED ORDER — DOCUSATE SODIUM 100 MG PO CAPS
100.0000 mg | ORAL_CAPSULE | Freq: Every day | ORAL | Status: DC
  Administered 2012-11-07: 100 mg via ORAL
  Filled 2012-11-06: qty 1

## 2012-11-06 MED ORDER — ONDANSETRON 8 MG PO TBDP
8.0000 mg | ORAL_TABLET | Freq: Three times a day (TID) | ORAL | Status: DC | PRN
  Administered 2012-11-06: 8 mg via ORAL
  Filled 2012-11-06: qty 1

## 2012-11-06 MED ORDER — ZOLPIDEM TARTRATE 5 MG PO TABS
5.0000 mg | ORAL_TABLET | Freq: Every evening | ORAL | Status: DC | PRN
  Administered 2012-11-06: 5 mg via ORAL
  Filled 2012-11-06: qty 1

## 2012-11-06 MED ORDER — ACETAMINOPHEN 325 MG PO TABS
650.0000 mg | ORAL_TABLET | ORAL | Status: DC | PRN
  Administered 2012-11-07: 650 mg via ORAL
  Filled 2012-11-06: qty 2

## 2012-11-06 MED ORDER — PANTOPRAZOLE SODIUM 40 MG PO TBEC
40.0000 mg | DELAYED_RELEASE_TABLET | Freq: Every day | ORAL | Status: DC
  Administered 2012-11-06 – 2012-11-07 (×2): 40 mg via ORAL
  Filled 2012-11-06 (×3): qty 1

## 2012-11-06 MED ORDER — CALCIUM CARBONATE ANTACID 500 MG PO CHEW
2.0000 | CHEWABLE_TABLET | ORAL | Status: DC | PRN
  Administered 2012-11-06 – 2012-11-07 (×3): 400 mg via ORAL
  Filled 2012-11-06: qty 1
  Filled 2012-11-06 (×2): qty 2
  Filled 2012-11-06: qty 1

## 2012-11-06 MED ORDER — HYDROCODONE-ACETAMINOPHEN 5-325 MG PO TABS
1.0000 | ORAL_TABLET | Freq: Once | ORAL | Status: AC
  Administered 2012-11-06: 1 via ORAL
  Filled 2012-11-06: qty 1

## 2012-11-06 MED ORDER — CALCIUM CARBONATE ANTACID 500 MG PO CHEW
400.0000 mg | CHEWABLE_TABLET | Freq: Once | ORAL | Status: AC
  Administered 2012-11-06: 400 mg via ORAL
  Filled 2012-11-06: qty 2

## 2012-11-06 MED ORDER — HYDROCODONE-ACETAMINOPHEN 5-325 MG PO TABS
1.0000 | ORAL_TABLET | Freq: Four times a day (QID) | ORAL | Status: DC | PRN
  Administered 2012-11-06: 1 via ORAL
  Filled 2012-11-06: qty 1

## 2012-11-06 NOTE — MAU Provider Note (Signed)
History     CSN: [id number]  Arrival date and time: 11/06/12 1443   First Provider Initiated Contact with Patient 11/06/12 1531      Chief Complaint  Patient presents with  . PIH eval    HPI This is a 27 y.o. female at [redacted]w[redacted]d who presents with report of headache daily unrelieved by Tylenol. States BPs have been elevated at times and last pregnancy had preeclampsia. Dr Renaldo Fiddler had her come here for labs.   Per Dr Renaldo Fiddler, will be admitted either for a 24 hr urine or induction of labor depending on her lab results.  OB History   Grav Para Term Preterm Abortions TAB SAB Ect Mult Living   4 1 0 1 2 0 2 0 0 1       History reviewed. No pertinent past medical history.  Past Surgical History  Procedure Laterality Date  . Cesarean section      Family History  Problem Relation Age of Onset  . Other Neg Hx   . Hypertension Father   . Diabetes Father   . Hypertension Maternal Grandmother   . Hypertension Maternal Grandfather   . Hypertension Paternal Grandmother   . Hypertension Paternal Grandfather     History  Substance Use Topics  . Smoking status: Never Smoker   . Smokeless tobacco: Not on file  . Alcohol Use: No    Allergies: No Known Allergies  Prescriptions prior to admission  Medication Sig Dispense Refill  . diphenhydrAMINE (BENADRYL) 25 MG tablet Take 25 mg by mouth every 6 (six) hours as needed.      . docusate sodium (COLACE) 100 MG capsule Take 100 mg by mouth 2 (two) times daily as needed. For constipation      . flintstones complete (FLINTSTONES) 60 MG chewable tablet Chew 2 tablets by mouth daily.      . metoCLOPramide (REGLAN) 10 MG tablet Take 10 mg by mouth 4 (four) times daily.      . ondansetron (ZOFRAN) 8 MG tablet Take 8 mg by mouth every 8 (eight) hours as needed. For nausea      . pantoprazole (PROTONIX) 40 MG tablet Take 1 tablet (40 mg total) by mouth daily.  30 tablet  5  . promethazine (PHENERGAN) 25 MG tablet Take 25 mg by mouth every 6 (six)  hours as needed. For nausea        Review of Systems  Constitutional: Negative for fever, chills and malaise/fatigue.  Eyes: Negative for blurred vision.  Gastrointestinal: Negative for nausea, vomiting, abdominal pain, diarrhea and constipation.  Genitourinary: Negative for dysuria.  Neurological: Positive for headaches. Negative for dizziness, focal weakness and weakness.   Physical Exam   Blood pressure 112/87, pulse 108, temperature 98 F (36.7 C), temperature source Oral, resp. rate 18, weight 201 lb 9.6 oz (91.445 kg).  Filed Vitals:   11/06/12 1453 11/06/12 1511 11/06/12 1515  BP: 134/75 125/66 112/87  Pulse: 122 104 108  Temp: 98 F (36.7 C)    TempSrc: Oral    Resp: 18    Weight: 201 lb 9.6 oz (91.445 kg)      Physical Exam  Constitutional: She is oriented to person, place, and time. She appears well-developed and well-nourished. No distress.  HENT:  Head: Normocephalic.  Neck: Normal range of motion. Neck supple.  Cardiovascular: Normal rate.   Respiratory: Effort normal.  GI: Soft. She exhibits no distension. There is no tenderness. There is no rebound and no guarding.  Musculoskeletal: Normal range  of motion. She exhibits no edema.  Neurological: She is alert and oriented to person, place, and time. She has normal reflexes. She displays normal reflexes. She exhibits normal muscle tone.  Skin: Skin is warm and dry.  Psychiatric: She has a normal mood and affect.   Results for orders placed during the hospital encounter of 11/06/12 (from the past 24 hour(s))  CBC     Status: Abnormal   Collection Time    11/06/12  3:45 PM      Result Value Range   WBC 11.5 (*) 4.0 - 10.5 K/uL   RBC 4.42  3.87 - 5.11 MIL/uL   Hemoglobin 9.2 (*) 12.0 - 15.0 g/dL   HCT 16.1 (*) 09.6 - 04.5 %   MCV 68.6 (*) 78.0 - 100.0 fL   MCH 20.8 (*) 26.0 - 34.0 pg   MCHC 30.4  30.0 - 36.0 g/dL   RDW 40.9 (*) 81.1 - 91.4 %   Platelets 367  150 - 400 K/uL    MAU Course   Procedures   Assessment and Plan  A:  SIUP at [redacted]w[redacted]d       Gestational hypertension with labile BPs  P:  Discussed with Dr Renaldo Fiddler      Will admit for Antenatal for 24 hr urine  [facility] 11/06/2012, 3:37 PM

## 2012-11-06 NOTE — MAU Note (Signed)
Name and DOB verified, pt confirmed the spelling is correct on IDband.

## 2012-11-06 NOTE — Plan of Care (Signed)
Problem: Consults Goal: Birthing Suites Patient Information Press F2 to bring up selections list Outcome: Completed/Met Date Met:  11/06/12  Pt < [redacted] weeks EGA

## 2012-11-06 NOTE — MAU Note (Addendum)
BP elevated past wk.  Has HA, off and on since yesterday, has not taken anything for it, no visual changes- though is light sensitive, increase in swelling. Denies epigastric pain.  Induced at 37 wks with last preg for PIH.

## 2012-11-06 NOTE — MAU Note (Signed)
Pt states she has a headache that has been off and on since yesterday.pt state headache started today at `1300.

## 2012-11-06 NOTE — H&P (Signed)
27 yo G4P0120 @ 36+2 wks presents w/ severe HA x 2 days.  Pt took vicodin last night for HA and had severe n/v following.  HA somewhat better today but not resoled.  No n/v today.  Pt w/ h/o IOL w/ last pregnancy b/c of pre-eclamplsia  PMHx:  negative PSx: c-section All:  NKDA Meds:  PNV, vicodin Shx:  No tobacco, etoh, ivdu    AF, VSS  BP 120-130/80s Gen - NAD Abd - gravid, NT Ext - trace edema bilaterally, 2+DTR CV - RRR Lungs - clear Cvx - deferred  Labs - reviewed.  Normal LFTs & plts.  Trace proteinuria  A/P:  HA and borderline BP w/ h/o pre-eclampsia Admit Bedrest 24 hr urine Rpt labs in am

## 2012-11-06 NOTE — Progress Notes (Signed)
Pt states she was sick last night when asked about nausea and vomiting. Pt states she has had diarrhea

## 2012-11-07 ENCOUNTER — Observation Stay (HOSPITAL_COMMUNITY): Payer: Federal, State, Local not specified - PPO

## 2012-11-07 LAB — COMPREHENSIVE METABOLIC PANEL
ALT: 6 U/L (ref 0–35)
AST: 11 U/L (ref 0–37)
Alkaline Phosphatase: 131 U/L — ABNORMAL HIGH (ref 39–117)
Calcium: 8.7 mg/dL (ref 8.4–10.5)
Potassium: 3.7 mEq/L (ref 3.5–5.1)
Sodium: 136 mEq/L (ref 135–145)
Total Protein: 5.9 g/dL — ABNORMAL LOW (ref 6.0–8.3)

## 2012-11-07 LAB — CBC
HCT: 28.6 % — ABNORMAL LOW (ref 36.0–46.0)
Hemoglobin: 8.7 g/dL — ABNORMAL LOW (ref 12.0–15.0)
MCH: 20.8 pg — ABNORMAL LOW (ref 26.0–34.0)
MCHC: 30.4 g/dL (ref 30.0–36.0)
MCV: 68.4 fL — ABNORMAL LOW (ref 78.0–100.0)

## 2012-11-07 LAB — CREATININE, URINE, 24 HOUR
Collection Interval-UCRE24: 24 hours
Creatinine, 24H Ur: 1620 mg/d (ref 700–1800)
Urine Total Volume-UCRE24: 1500 mL

## 2012-11-07 LAB — PROTEIN, URINE, 24 HOUR: Urine Total Volume-UPROT: 1500 mL

## 2012-11-07 MED ORDER — FERROUS SULFATE 325 (65 FE) MG PO TABS
325.0000 mg | ORAL_TABLET | Freq: Two times a day (BID) | ORAL | Status: DC
  Administered 2012-11-07: 325 mg via ORAL
  Filled 2012-11-07: qty 1

## 2012-11-07 MED ORDER — CITRIC ACID-SODIUM CITRATE 334-500 MG/5ML PO SOLN: 30.0000 mL | Freq: Once | ORAL | Status: DC

## 2012-11-07 MED ORDER — CITRIC ACID-SODIUM CITRATE 334-500 MG/5ML PO SOLN
ORAL | Status: AC
  Filled 2012-11-07: qty 15

## 2012-11-07 MED ORDER — BUTALBITAL-APAP-CAFFEINE 50-325-40 MG PO TABS
2.0000 | ORAL_TABLET | Freq: Once | ORAL | Status: AC
  Administered 2012-11-07: 2 via ORAL
  Filled 2012-11-07: qty 2

## 2012-11-07 NOTE — Progress Notes (Signed)
Pt has requested discharge prior to results of 24 hour urine because of inclement weather.  BP have all been in normal range, her HA has improved, and n/v are improving with protonix.  Her HELLP labs are all wnl and today's ultrasound showed a normally grown fetus with normal AFI.  Based on the patient's stable status, will allow discharge and will call patient with results of 24 hour urine.  Should there be >2g protein, will have the patient return for inpatient management.   Mitchel Honour, DO

## 2012-11-07 NOTE — Progress Notes (Signed)
Pt feeling dizzy and headache still present.

## 2012-11-07 NOTE — Progress Notes (Signed)
Subjective: Kelsey Rogers is a 27 y.o. [phone] at [redacted]w[redacted]d by ultrasound admitted for r/o PreE.  Currently has HA not unlike previous HAs she has had this pregnancy; unresponsive to Tylenol this AM.  Her heartburn is present but typically responds to Protonix which she was out of the last few days.  No vision change, RUQ pain, CP/SOB.  +FM.  No CTX or VB.     Objective: BP 108/63  Pulse 111  Temp(Src) 97.1 F (36.2 C) (Oral)  Resp 20  Ht 5' (1.524 m)  Wt 201 lb 9.6 oz (91.445 kg)  BMI 39.37 kg/m2 I/O last 3 completed shifts: In: 600 [P.O.:600] Out: 700 [Urine:700] Total I/O In: 0  Out: 275 [Urine:275]  FHT:  FHR: 135 bpm, variability: moderate,  accelerations:  Present,  decelerations:  Absent UC:   none SVE:    deferred  Labs: Lab Results  Component Value Date   WBC 10.7* 11/07/2012   HGB 8.7* 11/07/2012   HCT 28.6* 11/07/2012   MCV 68.4* 11/07/2012   PLT 345 11/07/2012    Assessment / Plan: 26yo [id number] with h/o PreE and elvevated BP -24 hour urine collecting; up at 5pm.  Will finalize plan pending results this pm.   -HELLP labs wnl and stable -NST BID -MFM U/S today -Fioricet for HA -Continue bedrest   MORRIS, MEGAN 11/07/2012, 8:51 AM

## 2012-11-07 NOTE — Discharge Summary (Signed)
Physician Discharge Summary  Patient ID: Kelsey Rogers MRN: [id number] DOB/AGE: 1986-03-08 26 y.o.  Admit date: 11/06/2012 Discharge date: 11/07/2012  Admission Diagnoses: Gestational HTN  Discharge Diagnoses: SAA Active Problems:   * No active hospital problems. *   Discharged Condition: good  Hospital Course: Patient was admitted from the office for 23 hour observation and 24 hour urine collection secondary to HA, n/v, h/o PreE and elevated BPs.  She remained normotensive during her hospitalization and her symptoms improved.  MFM ultrasound showed a normally grown fetus and normal fluid.  24 hour urine was collected, however, the patient requested early discharge prior to results returned secondary to inclement weather and the request was honored with next-day follow-up in the office.    Consults: MFM  Significant Diagnostic Studies: ultrasound, 24 hour urine  Treatments: none  Discharge Exam: Blood pressure 118/71, pulse 99, temperature 97.7 F (36.5 C), temperature source Oral, resp. rate 20, height 5' (1.524 m), weight 201 lb 9.6 oz (91.445 kg). General appearance: alert, cooperative and appears stated age GI: soft, non-tender; bowel sounds normal; no masses,  no organomegaly  Disposition: 01-Home or Self Care     Medication List    STOP taking these medications       diphenhydrAMINE 25 MG tablet  Commonly known as:  BENADRYL     docusate sodium 100 MG capsule  Commonly known as:  COLACE      TAKE these medications       acetaminophen 325 MG tablet  Commonly known as:  TYLENOL  Take 650 mg by mouth every 6 (six) hours as needed for pain. headache     flintstones complete 60 MG chewable tablet  Chew 2 tablets by mouth daily.     metoCLOPramide 10 MG tablet  Commonly known as:  REGLAN  Take 10 mg by mouth 4 (four) times daily.     ondansetron 8 MG tablet  Commonly known as:  ZOFRAN  Take 8 mg by mouth every 8 (eight) hours as needed. For nausea     pantoprazole 40 MG tablet  Commonly known as:  PROTONIX  Take 1 tablet (40 mg total) by mouth daily.     promethazine 25 MG tablet  Commonly known as:  PHENERGAN  Take 25 mg by mouth every 6 (six) hours as needed. For nausea     zolpidem 5 MG tablet  Commonly known as:  AMBIEN  Take 5 mg by mouth at bedtime as needed for sleep. sleep         Signed: Mitchel Honour 11/07/2012, 8:14 PM

## 2012-11-07 NOTE — Progress Notes (Signed)
Called Dr. Renaldo Fiddler for order for sodium citrate for heart burn. Orders received.

## 2012-11-07 NOTE — Progress Notes (Signed)
PT given and reviewed discharge instructions. Pt verbalized understanding. RN palpated fetal movement and pt confirmed fetal movement. Pt to follow up with Dr Rana Snare 11/08/12 @ 1130 in office.

## 2012-11-07 NOTE — Progress Notes (Signed)
RN notified pt of discussion with Dr Langston Masker. PT desires to go home.

## 2012-11-07 NOTE — Progress Notes (Signed)
PT wanting to know what the plan is and is worried about going home too late due to weather conditions. RN discussed option of going home with Dr Langston Masker. Dr Langston Masker offered for pt to go home and MD will notify pt of 24 hour urine results.

## 2012-11-14 ENCOUNTER — Encounter (HOSPITAL_COMMUNITY): Payer: Self-pay | Admitting: Anesthesiology

## 2012-11-14 ENCOUNTER — Inpatient Hospital Stay (HOSPITAL_COMMUNITY): Payer: Federal, State, Local not specified - PPO | Admitting: Anesthesiology

## 2012-11-14 ENCOUNTER — Encounter (HOSPITAL_COMMUNITY)
Admission: RE | Disposition: A | Discharge status: Home / Self Care | Payer: Self-pay | Source: Ambulatory Visit | Attending: Obstetrics and Gynecology

## 2012-11-14 ENCOUNTER — Inpatient Hospital Stay (HOSPITAL_COMMUNITY)
Admission: RE | Admit: 2012-11-14 | Discharge: 2012-11-16 | DRG: 371 | Disposition: A | Discharge status: Home / Self Care | Payer: Federal, State, Local not specified - PPO | Source: Ambulatory Visit | Attending: Obstetrics and Gynecology | Admitting: Obstetrics and Gynecology

## 2012-11-14 DIAGNOSIS — Z98891 History of uterine scar from previous surgery: Secondary | ICD-10-CM

## 2012-11-14 DIAGNOSIS — O34219 Maternal care for unspecified type scar from previous cesarean delivery: Principal | ICD-10-CM | POA: Diagnosis present

## 2012-11-14 DIAGNOSIS — O139 Gestational [pregnancy-induced] hypertension without significant proteinuria, unspecified trimester: Secondary | ICD-10-CM | POA: Diagnosis present

## 2012-11-14 LAB — CBC
HCT: 31.8 % — ABNORMAL LOW (ref 36.0–46.0)
MCV: 67.2 fL — ABNORMAL LOW (ref 78.0–100.0)
RBC: 4.73 MIL/uL (ref 3.87–5.11)
WBC: 12.5 10*3/uL — ABNORMAL HIGH (ref 4.0–10.5)

## 2012-11-14 LAB — TYPE AND SCREEN
ABO/RH(D): O POS
Antibody Screen: NEGATIVE

## 2012-11-14 SURGERY — Surgical Case
Anesthesia: Spinal | Wound class: Clean Contaminated

## 2012-11-14 MED ORDER — FENTANYL CITRATE 0.05 MG/ML IJ SOLN
INTRAMUSCULAR | Status: DC | PRN
  Administered 2012-11-14: 25 ug via INTRATHECAL

## 2012-11-14 MED ORDER — DIBUCAINE 1 % RE OINT: 1.0000 "application " | TOPICAL_OINTMENT | RECTAL | Status: DC | PRN

## 2012-11-14 MED ORDER — LACTATED RINGERS IV SOLN
INTRAVENOUS | Status: DC
  Administered 2012-11-14 (×3): via INTRAVENOUS

## 2012-11-14 MED ORDER — SODIUM CHLORIDE 0.9 % IJ SOLN: 3.0000 mL | INTRAMUSCULAR | Status: DC | PRN

## 2012-11-14 MED ORDER — ONDANSETRON HCL 4 MG PO TABS: 4.0000 mg | ORAL_TABLET | ORAL | Status: DC | PRN

## 2012-11-14 MED ORDER — KETOROLAC TROMETHAMINE 30 MG/ML IJ SOLN
30.0000 mg | Freq: Four times a day (QID) | INTRAMUSCULAR | Status: AC | PRN
  Administered 2012-11-14 – 2012-11-15 (×3): 30 mg via INTRAVENOUS
  Filled 2012-11-14 (×2): qty 1

## 2012-11-14 MED ORDER — ONDANSETRON HCL 4 MG/2ML IJ SOLN
INTRAMUSCULAR | Status: DC | PRN
  Administered 2012-11-14: 4 mg via INTRAVENOUS

## 2012-11-14 MED ORDER — FENTANYL CITRATE 0.05 MG/ML IJ SOLN
25.0000 ug | INTRAMUSCULAR | Status: DC | PRN
  Administered 2012-11-14 (×4): 25 ug via INTRAVENOUS

## 2012-11-14 MED ORDER — SCOPOLAMINE 1 MG/3DAYS TD PT72: 1.0000 | MEDICATED_PATCH | Freq: Once | TRANSDERMAL | Status: DC

## 2012-11-14 MED ORDER — MENTHOL 3 MG MT LOZG: 1.0000 | LOZENGE | OROMUCOSAL | Status: DC | PRN

## 2012-11-14 MED ORDER — WITCH HAZEL-GLYCERIN EX PADS: 1.0000 "application " | MEDICATED_PAD | CUTANEOUS | Status: DC | PRN

## 2012-11-14 MED ORDER — SIMETHICONE 80 MG PO CHEW
80.0000 mg | CHEWABLE_TABLET | Freq: Three times a day (TID) | ORAL | Status: DC
  Administered 2012-11-14 – 2012-11-16 (×7): 80 mg via ORAL

## 2012-11-14 MED ORDER — ONDANSETRON HCL 4 MG/2ML IJ SOLN: 4.0000 mg | Freq: Three times a day (TID) | INTRAMUSCULAR | Status: DC | PRN

## 2012-11-14 MED ORDER — LANOLIN HYDROUS EX OINT: 1.0000 "application " | TOPICAL_OINTMENT | CUTANEOUS | Status: DC | PRN

## 2012-11-14 MED ORDER — SIMETHICONE 80 MG PO CHEW: 80.0000 mg | CHEWABLE_TABLET | ORAL | Status: DC | PRN

## 2012-11-14 MED ORDER — OXYTOCIN 40 UNITS IN LACTATED RINGERS INFUSION - SIMPLE MED
62.5000 mL/h | INTRAVENOUS | Status: AC
  Administered 2012-11-14: 62.5 mL/h via INTRAVENOUS
  Filled 2012-11-14: qty 1000

## 2012-11-14 MED ORDER — MEDROXYPROGESTERONE ACETATE 150 MG/ML IM SUSP: 150.0000 mg | INTRAMUSCULAR | Status: DC | PRN

## 2012-11-14 MED ORDER — TETANUS-DIPHTH-ACELL PERTUSSIS 5-2.5-18.5 LF-MCG/0.5 IM SUSP: 0.5000 mL | Freq: Once | INTRAMUSCULAR | Status: DC

## 2012-11-14 MED ORDER — MEPERIDINE HCL 25 MG/ML IJ SOLN: 6.2500 mg | INTRAMUSCULAR | Status: DC | PRN

## 2012-11-14 MED ORDER — PHENYLEPHRINE HCL 10 MG/ML IJ SOLN
INTRAMUSCULAR | Status: DC | PRN
  Administered 2012-11-14: 80 ug via INTRAVENOUS
  Administered 2012-11-14: 40 ug via INTRAVENOUS
  Administered 2012-11-14: 80 ug via INTRAVENOUS
  Administered 2012-11-14: 120 ug via INTRAVENOUS
  Administered 2012-11-14 (×2): 80 ug via INTRAVENOUS

## 2012-11-14 MED ORDER — 0.9 % SODIUM CHLORIDE (POUR BTL) OPTIME
TOPICAL | Status: DC | PRN
  Administered 2012-11-14: 1000 mL

## 2012-11-14 MED ORDER — KETOROLAC TROMETHAMINE 30 MG/ML IJ SOLN: 30.0000 mg | Freq: Four times a day (QID) | INTRAMUSCULAR | Status: AC | PRN

## 2012-11-14 MED ORDER — OXYCODONE-ACETAMINOPHEN 5-325 MG PO TABS: 1.0000 | ORAL_TABLET | ORAL | Status: DC | PRN

## 2012-11-14 MED ORDER — OXYTOCIN 10 UNIT/ML IJ SOLN
40.0000 [IU] | INTRAVENOUS | Status: DC | PRN
  Administered 2012-11-14: 40 [IU] via INTRAVENOUS

## 2012-11-14 MED ORDER — NALBUPHINE HCL 10 MG/ML IJ SOLN
5.0000 mg | INTRAMUSCULAR | Status: DC | PRN
  Administered 2012-11-15 (×2): 10 mg via INTRAVENOUS
  Filled 2012-11-14 (×3): qty 1

## 2012-11-14 MED ORDER — OXYTOCIN 10 UNIT/ML IJ SOLN
INTRAMUSCULAR | Status: AC
  Filled 2012-11-14: qty 4

## 2012-11-14 MED ORDER — FENTANYL CITRATE 0.05 MG/ML IJ SOLN
INTRAMUSCULAR | Status: AC
  Filled 2012-11-14: qty 2

## 2012-11-14 MED ORDER — ONDANSETRON HCL 4 MG/2ML IJ SOLN: 4.0000 mg | INTRAMUSCULAR | Status: DC | PRN

## 2012-11-14 MED ORDER — ONDANSETRON HCL 4 MG/2ML IJ SOLN
INTRAMUSCULAR | Status: AC
  Filled 2012-11-14: qty 2

## 2012-11-14 MED ORDER — PRENATAL MULTIVITAMIN CH
1.0000 | ORAL_TABLET | Freq: Every day | ORAL | Status: DC
  Administered 2012-11-15 – 2012-11-16 (×2): 1 via ORAL
  Filled 2012-11-14 (×2): qty 1

## 2012-11-14 MED ORDER — IBUPROFEN 600 MG PO TABS
600.0000 mg | ORAL_TABLET | Freq: Four times a day (QID) | ORAL | Status: DC
  Administered 2012-11-15 – 2012-11-16 (×5): 600 mg via ORAL
  Filled 2012-11-14: qty 1

## 2012-11-14 MED ORDER — DIPHENHYDRAMINE HCL 50 MG/ML IJ SOLN: 25.0000 mg | INTRAMUSCULAR | Status: DC | PRN

## 2012-11-14 MED ORDER — MORPHINE SULFATE (PF) 0.5 MG/ML IJ SOLN
INTRAMUSCULAR | Status: DC | PRN
  Administered 2012-11-14: .15 mg via INTRATHECAL

## 2012-11-14 MED ORDER — DEXTROSE IN LACTATED RINGERS 5 % IV SOLN: INTRAVENOUS | Status: DC

## 2012-11-14 MED ORDER — DIPHENHYDRAMINE HCL 25 MG PO CAPS: 25.0000 mg | ORAL_CAPSULE | Freq: Four times a day (QID) | ORAL | Status: DC | PRN

## 2012-11-14 MED ORDER — SCOPOLAMINE 1 MG/3DAYS TD PT72
MEDICATED_PATCH | TRANSDERMAL | Status: AC
  Administered 2012-11-14: 1.5 mg via TRANSDERMAL
  Filled 2012-11-14: qty 1

## 2012-11-14 MED ORDER — CEFAZOLIN SODIUM-DEXTROSE 2-3 GM-% IV SOLR
2.0000 g | INTRAVENOUS | Status: AC
  Administered 2012-11-14: 2 g via INTRAVENOUS

## 2012-11-14 MED ORDER — NALBUPHINE HCL 10 MG/ML IJ SOLN
5.0000 mg | INTRAMUSCULAR | Status: DC | PRN
  Administered 2012-11-15: 10 mg via SUBCUTANEOUS
  Filled 2012-11-14 (×2): qty 1

## 2012-11-14 MED ORDER — METOCLOPRAMIDE HCL 5 MG/ML IJ SOLN: 10.0000 mg | Freq: Three times a day (TID) | INTRAMUSCULAR | Status: DC | PRN

## 2012-11-14 MED ORDER — DIPHENHYDRAMINE HCL 25 MG PO CAPS
25.0000 mg | ORAL_CAPSULE | ORAL | Status: DC | PRN
  Administered 2012-11-14: 25 mg via ORAL
  Filled 2012-11-14 (×2): qty 1

## 2012-11-14 MED ORDER — MORPHINE SULFATE 0.5 MG/ML IJ SOLN
INTRAMUSCULAR | Status: AC
  Filled 2012-11-14: qty 10

## 2012-11-14 MED ORDER — NALOXONE HCL 1 MG/ML IJ SOLN
1.0000 ug/kg/h | INTRAVENOUS | Status: DC | PRN
  Filled 2012-11-14: qty 2

## 2012-11-14 MED ORDER — MEASLES, MUMPS & RUBELLA VAC ~~LOC~~ INJ: 0.5000 mL | INJECTION | Freq: Once | SUBCUTANEOUS | Status: DC

## 2012-11-14 MED ORDER — KETOROLAC TROMETHAMINE 30 MG/ML IJ SOLN
INTRAMUSCULAR | Status: AC
  Filled 2012-11-14: qty 1

## 2012-11-14 MED ORDER — METOCLOPRAMIDE HCL 5 MG/ML IJ SOLN: 10.0000 mg | Freq: Once | INTRAMUSCULAR | Status: DC | PRN

## 2012-11-14 MED ORDER — NALOXONE HCL 0.4 MG/ML IJ SOLN: 0.4000 mg | INTRAMUSCULAR | Status: DC | PRN

## 2012-11-14 MED ORDER — PHENYLEPHRINE 40 MCG/ML (10ML) SYRINGE FOR IV PUSH (FOR BLOOD PRESSURE SUPPORT)
PREFILLED_SYRINGE | INTRAVENOUS | Status: AC
  Filled 2012-11-14: qty 15

## 2012-11-14 MED ORDER — SENNOSIDES-DOCUSATE SODIUM 8.6-50 MG PO TABS
2.0000 | ORAL_TABLET | Freq: Every day | ORAL | Status: DC
  Administered 2012-11-14 – 2012-11-15 (×2): 2 via ORAL

## 2012-11-14 MED ORDER — DIPHENHYDRAMINE HCL 50 MG/ML IJ SOLN
12.5000 mg | INTRAMUSCULAR | Status: DC | PRN
  Administered 2012-11-14: 12.5 mg via INTRAVENOUS
  Filled 2012-11-14: qty 1

## 2012-11-14 MED ORDER — IBUPROFEN 600 MG PO TABS
600.0000 mg | ORAL_TABLET | Freq: Four times a day (QID) | ORAL | Status: DC | PRN
  Filled 2012-11-14 (×4): qty 1

## 2012-11-14 SURGICAL SUPPLY — 30 items
CLOTH BEACON ORANGE TIMEOUT ST (SAFETY) ×2 IMPLANT
DERMABOND ADVANCED (GAUZE/BANDAGES/DRESSINGS) ×1
DERMABOND ADVANCED .7 DNX12 (GAUZE/BANDAGES/DRESSINGS) ×1 IMPLANT
DRAPE LG THREE QUARTER DISP (DRAPES) ×2 IMPLANT
DRSG OPSITE POSTOP 4X10 (GAUZE/BANDAGES/DRESSINGS) ×2 IMPLANT
DURAPREP 26ML APPLICATOR (WOUND CARE) ×2 IMPLANT
ELECT REM PT RETURN 9FT ADLT (ELECTROSURGICAL) ×2
ELECTRODE REM PT RTRN 9FT ADLT (ELECTROSURGICAL) ×1 IMPLANT
EXTRACTOR VACUUM M CUP 4 TUBE (SUCTIONS) IMPLANT
GLOVE BIO SURGEON STRL SZ 6.5 (GLOVE) ×2 IMPLANT
GLOVE BIOGEL PI IND STRL 7.0 (GLOVE) ×1 IMPLANT
GLOVE BIOGEL PI INDICATOR 7.0 (GLOVE) ×1
GLOVE NEODERM STER SZ 7 (GLOVE) ×2 IMPLANT
GOWN STRL REIN XL XLG (GOWN DISPOSABLE) ×4 IMPLANT
KIT ABG SYR 3ML LUER SLIP (SYRINGE) ×2 IMPLANT
NEEDLE HYPO 25X5/8 SAFETYGLIDE (NEEDLE) ×2 IMPLANT
NS IRRIG 1000ML POUR BTL (IV SOLUTION) ×2 IMPLANT
PACK C SECTION WH (CUSTOM PROCEDURE TRAY) ×2 IMPLANT
PAD OB MATERNITY 4.3X12.25 (PERSONAL CARE ITEMS) ×2 IMPLANT
SLEEVE SCD COMPRESS KNEE MED (MISCELLANEOUS) IMPLANT
STAPLER VISISTAT 35W (STAPLE) ×2 IMPLANT
SUT CHROMIC 0 CT 802H (SUTURE) IMPLANT
SUT CHROMIC 0 CTX 36 (SUTURE) ×6 IMPLANT
SUT MON AB-0 CT1 36 (SUTURE) ×2 IMPLANT
SUT PDS AB 0 CTX 60 (SUTURE) ×2 IMPLANT
SUT PLAIN 0 NONE (SUTURE) IMPLANT
SUT VIC AB 4-0 KS 27 (SUTURE) IMPLANT
TOWEL OR 17X24 6PK STRL BLUE (TOWEL DISPOSABLE) ×6 IMPLANT
TRAY FOLEY CATH 14FR (SET/KITS/TRAYS/PACK) IMPLANT
WATER STERILE IRR 1000ML POUR (IV SOLUTION) ×2 IMPLANT

## 2012-11-14 NOTE — Anesthesia Procedure Notes (Signed)

## 2012-11-14 NOTE — Anesthesia Preprocedure Evaluation (Addendum)
Anesthesia Evaluation  Patient identified by MRN, date of birth, ID band Patient awake    Reviewed: Allergy & Precautions, H&P , Patient's Chart, lab work & pertinent test results  Airway Mallampati: II TM Distance: >3 FB Neck ROM: Full    Dental no notable dental hx. (+) Teeth Intact   Pulmonary neg pulmonary ROS,  breath sounds clear to auscultation  Pulmonary exam normal       Cardiovascular negative cardio ROS  Rhythm:Regular Rate:Normal  PIH   Neuro/Psych negative neurological ROS  negative psych ROS   GI/Hepatic Neg liver ROS, GERD-  Medicated and Controlled,  Endo/Other  negative endocrine ROSMorbid obesity  Renal/GU negative Renal ROS     Musculoskeletal   Abdominal Normal abdominal exam  (+)   Peds  Hematology negative hematology ROS (+)   Anesthesia Other Findings   Reproductive/Obstetrics (+) Pregnancy                          Anesthesia Physical Anesthesia Plan  ASA: III  Anesthesia Plan: Spinal   Post-op Pain Management:    Induction:   Airway Management Planned:   Additional Equipment:   Intra-op Plan:   Post-operative Plan:   Informed Consent: I have reviewed the patients History and Physical, chart, labs and discussed the procedure including the risks, benefits and alternatives for the proposed anesthesia with the patient or authorized representative who has indicated his/her understanding and acceptance.   Dental Advisory Given  Plan Discussed with: Anesthesiologist, CRNA and Surgeon  Anesthesia Plan Comments:        Anesthesia Quick Evaluation

## 2012-11-14 NOTE — Anesthesia Postprocedure Evaluation (Signed)
  Anesthesia Post-op Note  Patient: Kelsey Rogers  Procedure(s) Performed: Procedure(s) with comments: CESAREAN SECTION (N/A) - Repeat  Patient is awake, responsive, moving her legs, and has signs of resolution of her numbness. Pain and nausea are reasonably well controlled. Vital signs are stable and clinically acceptable. Oxygen saturation is clinically acceptable. There are no apparent anesthetic complications at this time. Patient is ready for discharge.

## 2012-11-14 NOTE — Transfer of Care (Signed)
Immediate Anesthesia Transfer of Care Note  Patient: Kelsey Rogers  Procedure(s) Performed: Procedure(s) with comments: CESAREAN SECTION (N/A) - Repeat  Patient Location: PACU  Anesthesia Type:Spinal  Level of Consciousness: awake, alert  and oriented  Airway & Oxygen Therapy: Patient Spontanous Breathing  Post-op Assessment: Report given to PACU RN and Post -op Vital signs reviewed and stable  Post vital signs: stable  Complications: No apparent anesthesia complications

## 2012-11-14 NOTE — H&P (Signed)
27 yo G4P0120 @ 37+ wks presents for rpt c-section.  Pregnancy complicated by gestational HTN.  BP in office 140s/80-90s for last 2 weeks.  Pt with persistant HA.  No n/v.   Pt w/ h/o IOL w/ last pregnancy b/c of pre-eclamplsia    PMHx: negative  PSx: c-section  All: NKDA  Meds: PNV, vicodin  Shx: No tobacco, etoh, ivdu   AF, VSS   Gen - NAD  Abd - gravid, NT  Ext - trace edema bilaterally, 2+DTR  CV - RRR  Lungs - clear  Cvx - closed  A/P:  IUP at 37+ weeks, Gestational HTN, prior c-section Prior c-section, desires repeat R/b/a discussed, informed consent

## 2012-11-14 NOTE — Op Note (Signed)
Cesarean Section Procedure Note   REED EIFERT  11/14/2012  Indications: Scheduled Proceedure/Maternal Request and gestational HTN   Pre-operative Diagnosis: previous cesarean section, gestational hypertension.   Post-operative Diagnosis: Same   Surgeon: Surgeon(s) and Role:    * Zelphia Cairo, MD - Primary   Assistants: none  Anesthesia: spinal   Procedure Details:  The patient was seen in the Holding Room. The risks, benefits, complications, treatment options, and expected outcomes were discussed with the patient. The patient concurred with the proposed plan, giving informed consent. identified as Kelsey Rogers and the procedure verified as C-Section Delivery. A Time Out was held and the above information confirmed.  After induction of anesthesia, the patient was draped and prepped in the usual sterile manner. A transverse was made and carried down through the subcutaneous tissue to the fascia. Fascial incision was made and extended transversely. The fascia was separated from the underlying rectus tissue superiorly and inferiorly. The peritoneum was identified and entered. Peritoneal incision was extended longitudinally. The utero-vesical peritoneal reflection was incised transversely and the bladder flap was bluntly freed from the lower uterine segment. A low transverse uterine incision was made. Delivered from cephalic presentation was a vigerous female infant with Apgar scores of 8 at one minute and 8 at five minutes. Cord ph was not sent the umbilical cord was clamped and cut cord blood was obtained for evaluation. The placenta was removed Intact and appeared normal. The uterine outline, tubes and ovaries appeared normal}. The uterine incision was closed with running locked sutures of 0chromic gut.   Hemostasis was observed. Lavage was carried out until clear. The peritoneum was closed with monocryl.  The fascia was then reapproximated with running sutures of 0PDS. The skin was closed  with staples.   Instrument, sponge, and needle counts were correct prior the abdominal closure and were correct at the conclusion of the case.     Estimated Blood Loss: 500cc  Urine Output: clear  Specimens: @ORSPECIMEN @   Complications: no complications  Disposition: PACU - hemodynamically stable.   Maternal Condition: stable   Baby condition / location:  stable with mom  Attending Attestation: I was present and scrubbed for the entire procedure.   Signed: Surgeon(s): Zelphia Cairo, MD

## 2012-11-15 ENCOUNTER — Encounter (HOSPITAL_COMMUNITY): Payer: Self-pay | Admitting: Obstetrics and Gynecology

## 2012-11-15 LAB — CBC
HCT: 27.3 % — ABNORMAL LOW (ref 36.0–46.0)
MCV: 67.6 fL — ABNORMAL LOW (ref 78.0–100.0)
RDW: 17.1 % — ABNORMAL HIGH (ref 11.5–15.5)
WBC: 15 10*3/uL — ABNORMAL HIGH (ref 4.0–10.5)

## 2012-11-15 MED ORDER — HYDROCODONE-ACETAMINOPHEN 5-325 MG PO TABS
1.0000 | ORAL_TABLET | ORAL | Status: DC | PRN
  Administered 2012-11-15 (×3): 2 via ORAL
  Filled 2012-11-15 (×3): qty 2

## 2012-11-15 MED ORDER — HYDROMORPHONE HCL 2 MG PO TABS
2.0000 mg | ORAL_TABLET | ORAL | Status: DC | PRN
  Administered 2012-11-15 – 2012-11-16 (×3): 2 mg via ORAL
  Filled 2012-11-15 (×3): qty 1

## 2012-11-15 NOTE — Anesthesia Postprocedure Evaluation (Signed)
  Anesthesia Post-op Note  Patient: Kelsey Rogers  Procedure(s) Performed: Procedure(s) with comments: CESAREAN SECTION (N/A) - Repeat  Patient Location: PACU and Mother/Baby  Anesthesia Type:Spinal  Level of Consciousness: awake, alert  and oriented  Airway and Oxygen Therapy: Patient Spontanous Breathing  Post-op Pain: mild  Post-op Assessment: Patient's Cardiovascular Status Stable, Respiratory Function Stable, No signs of Nausea or vomiting and Adequate PO intake  Post-op Vital Signs: stable  Complications: No apparent anesthesia complications

## 2012-11-15 NOTE — Progress Notes (Signed)
Subjective: Postpartum Day 1: Cesarean Delivery Patient reports tolerating PO and no problems voiding.    Objective: Vital signs in last 24 hours: Temp:  [97.2 F (36.2 C)-98.4 F (36.9 C)] 98.4 F (36.9 C) (03/14 0821) Pulse Rate:  [65-109] 95 (03/14 0821) Resp:  [14-40] 14 (03/14 0821) BP: (112-135)/(44-98) 118/75 mmHg (03/14 0821) SpO2:  [97 %-100 %] 98 % (03/14 0821) Weight:  [201 lb (91.173 kg)] 201 lb (91.173 kg) (03/13 1300)  Physical Exam:  General: alert and cooperative Lochia: appropriate Uterine Fundus: firm Incision: honeycomb dressing noted with small drainage on r margin of bandage. No active bleeding DVT Evaluation: No evidence of DVT seen on physical exam. No significant calf/ankle edema.   Recent Labs  11/14/12 1150 11/15/12 0500  HGB 9.5* 8.2*  HCT 31.8* 27.3*    Assessment/Plan: Status post Cesarean section. Doing well postoperatively.  Change pain meds to Vicodin, patient complains of itching with percocet and morphine.  Quanna Wittke G 11/15/2012, 8:28 AM

## 2012-11-16 DIAGNOSIS — Z98891 History of uterine scar from previous surgery: Secondary | ICD-10-CM | POA: Diagnosis not present

## 2012-11-16 DIAGNOSIS — O139 Gestational [pregnancy-induced] hypertension without significant proteinuria, unspecified trimester: Secondary | ICD-10-CM | POA: Diagnosis present

## 2012-11-16 MED ORDER — HYDROMORPHONE HCL 2 MG PO TABS: 2.0000 mg | ORAL_TABLET | ORAL | Status: DC | PRN

## 2012-11-16 NOTE — Discharge Summary (Signed)
Obstetric Discharge Summary Reason for Admission: cesarean section Prenatal Procedures: Preeclampsia Intrapartum Procedures: cesarean: low cervical, transverse Postpartum Procedures: none Complications-Operative and Postpartum: none Hemoglobin  Date Value Range Status  11/15/2012 8.2* 12.0 - 15.0 g/dL Final     HCT  Date Value Range Status  11/15/2012 27.3* 36.0 - 46.0 % Final    Physical Exam:  General: alert Lochia: appropriate Uterine Fundus: firm Incision: healing well DVT Evaluation: No evidence of DVT seen on physical exam.  Discharge Diagnoses: Term Pregnancy-delivered  Discharge Information: Date: 11/16/2012 Activity: pelvic rest Diet: routine Medications: PNV and dilaudid Condition: stable Instructions: refer to practice specific booklet Discharge to: home   Newborn Data: Live born female  Birth Weight: 7 lb 0.7 oz (3195 g) APGAR: 8, 8  Home with mother.  Kelsey Rogers S 11/16/2012, 11:13 AM

## 2012-11-29 NOTE — MAU Provider Note (Signed)
Patient Name Sex DOB SSN    Kelsey Rogers, Kelsey Rogers Female 10-11-1985 [id number]       MAU Provider Note signed by Aviva Signs, CNM at 11/06/2012  4:52 PM    Author: Aviva Signs, CNM Service: (none) Author Type: Certified Nurse Midwife    Filed: 11/06/2012  4:52 PM Note Time: 11/06/2012  3:37 PM Cosign Required: Yes          History        CSN: [id number]   Arrival date and time: 11/06/12 1443    First Provider Initiated Contact with Patient 11/06/12 1531          Chief Complaint   Patient presents with   .  PIH eval       HPI This is a 27 y.o. female at [redacted]w[redacted]d who presents with report of headache daily unrelieved by Tylenol. States BPs have been elevated at times and last pregnancy had preeclampsia. Dr Renaldo Fiddler had her come here for labs.    Per Dr Renaldo Fiddler, will be admitted either for a 24 hr urine or induction of labor depending on her lab results.    OB History     Grav  Para  Term  Preterm  Abortions  TAB  SAB  Ect  Mult  Living     4  1  0  1  2  0  2  0  0  1           History reviewed. No pertinent past medical history.    Past Surgical History   Procedure  Laterality  Date   .  Cesarean section             Family History   Problem  Relation  Age of Onset   .  Other  Neg Hx     .  Hypertension  Father     .  Diabetes  Father     .  Hypertension  Maternal Grandmother     .  Hypertension  Maternal Grandfather     .  Hypertension  Paternal Grandmother     .  Hypertension  Paternal Grandfather           History   Substance Use Topics   .  Smoking status:  Never Smoker    .  Smokeless tobacco:  Not on file   .  Alcohol Use:  No        Allergies: No Known Allergies    Prescriptions prior to admission   Medication  Sig  Dispense  Refill   .  diphenhydrAMINE (BENADRYL) 25 MG tablet  Take 25 mg by mouth every 6 (six) hours as needed.         .  docusate sodium (COLACE) 100 MG capsule  Take 100 mg by mouth 2 (two) times daily as needed. For  constipation         .  flintstones complete (FLINTSTONES) 60 MG chewable tablet  Chew 2 tablets by mouth daily.         .  metoCLOPramide (REGLAN) 10 MG tablet  Take 10 mg by mouth 4 (four) times daily.         .  ondansetron (ZOFRAN) 8 MG tablet  Take 8 mg by mouth every 8 (eight) hours as needed. For nausea         .  pantoprazole (PROTONIX) 40 MG tablet  Take 1 tablet (40 mg total) by mouth daily.   30  tablet   5   .  promethazine (PHENERGAN) 25 MG tablet  Take 25 mg by mouth every 6 (six) hours as needed. For nausea              Review of Systems  Constitutional: Negative for fever, chills and malaise/fatigue.  Eyes: Negative for blurred vision.  Gastrointestinal: Negative for nausea, vomiting, abdominal pain, diarrhea and constipation.  Genitourinary: Negative for dysuria.  Neurological: Positive for headaches. Negative for dizziness, focal weakness and weakness.     Physical Exam      Blood pressure 112/87, pulse 108, temperature 98 F (36.7 C), temperature source Oral, resp. rate 18, weight 201 lb 9.6 oz (91.445 kg).    Filed Vitals:     11/06/12 1453  11/06/12 1511  11/06/12 1515   BP:  134/75  125/66  112/87   Pulse:  122  104  108   Temp:  98 F (36.7 C)       TempSrc:  Oral       Resp:  18       Weight:  201 lb 9.6 oz (91.445 kg)            Physical Exam  Constitutional: She is oriented to person, place, and time. She appears well-developed and well-nourished. No distress.  HENT:   Head: Normocephalic.  Neck: Normal range of motion. Neck supple.  Cardiovascular: Normal rate.   Respiratory: Effort normal.  GI: Soft. She exhibits no distension. There is no tenderness. There is no rebound and no guarding.  Musculoskeletal: Normal range of motion. She exhibits no edema.  Neurological: She is alert and oriented to person, place, and time. She has normal reflexes. She displays normal reflexes. She exhibits normal muscle tone.  Skin: Skin is warm and dry.   Psychiatric: She has a normal mood and affect.     Results for orders placed during the hospital encounter of 11/06/12 (from the past 24 hour(s))   CBC     Status: Abnormal     Collection Time      11/06/12  3:45 PM       Result  Value  Range     WBC  11.5 (*)  4.0 - 10.5 K/uL     RBC  4.42   3.87 - 5.11 MIL/uL     Hemoglobin  9.2 (*)  12.0 - 15.0 g/dL     HCT  16.1 (*)  09.6 - 46.0 %     MCV  68.6 (*)  78.0 - 100.0 fL     MCH  20.8 (*)  26.0 - 34.0 pg     MCHC  30.4   30.0 - 36.0 g/dL     RDW  04.5 (*)  40.9 - 15.5 %     Platelets  367   150 - 400 K/uL         MAU Course    Procedures      Assessment and Plan    A:  [facility] at [redacted]w[redacted]d        Gestational hypertension with labile BPs   P:  Discussed with Dr Renaldo Fiddler       Will admit for Antenatal for 24 hr urine   [facility] 11/06/2012, 3:37 PM

## 2014-07-06 ENCOUNTER — Encounter (HOSPITAL_COMMUNITY): Payer: Self-pay | Admitting: Obstetrics and Gynecology

## 2014-07-27 ENCOUNTER — Encounter (HOSPITAL_COMMUNITY): Payer: Self-pay | Admitting: Emergency Medicine

## 2014-07-27 ENCOUNTER — Emergency Department (INDEPENDENT_AMBULATORY_CARE_PROVIDER_SITE_OTHER)
Admission: EM | Admit: 2014-07-27 | Discharge: 2014-07-27 | Disposition: A | Discharge status: Home / Self Care | Payer: Self-pay | Source: Home / Self Care | Attending: Family Medicine | Admitting: Family Medicine

## 2014-07-27 DIAGNOSIS — M5412 Radiculopathy, cervical region: Secondary | ICD-10-CM

## 2014-07-27 DIAGNOSIS — M7581 Other shoulder lesions, right shoulder: Secondary | ICD-10-CM

## 2014-07-27 DIAGNOSIS — J0110 Acute frontal sinusitis, unspecified: Secondary | ICD-10-CM

## 2014-07-27 MED ORDER — FLUTICASONE PROPIONATE 50 MCG/ACT NA SUSP: 2.0000 | Freq: Every day | NASAL | Status: DC

## 2014-07-27 MED ORDER — MELOXICAM 15 MG PO TABS: 7.5000 mg | ORAL_TABLET | Freq: Every day | ORAL | Status: DC

## 2014-07-27 MED ORDER — FLUCONAZOLE 150 MG PO TABS: 150.0000 mg | ORAL_TABLET | Freq: Every day | ORAL | Status: DC

## 2014-07-27 MED ORDER — METHOCARBAMOL 500 MG PO TABS: 500.0000 mg | ORAL_TABLET | Freq: Four times a day (QID) | ORAL | Status: DC | PRN

## 2014-07-27 MED ORDER — AMOXICILLIN-POT CLAVULANATE 875-125 MG PO TABS: 1.0000 | ORAL_TABLET | Freq: Two times a day (BID) | ORAL | Status: DC

## 2014-07-27 MED ORDER — IPRATROPIUM BROMIDE 0.06 % NA SOLN
2.0000 | Freq: Four times a day (QID) | NASAL | Status: DC
Start: 2014-07-27 — End: 2015-12-09

## 2014-07-27 NOTE — ED Notes (Signed)
Pt is here for a cough she has had for 8 days.  She has tried several OTC medications with no relief.  She also states she has bilateral ear pain and nasal congestion.  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

## 2014-07-27 NOTE — Discharge Instructions (Signed)
Your neck and shoulder pain are likely from either or both cervical radiculopathy and rotator cuff strain Please start the exercises and use the meloxicam for pain and inflammation and the robaxin as a muscle relaxer. The robaxin may make you sleepy Please use the augmentin for the infection Please also use the flonase at night and the atrovent during the day to help with nasal discharge and cough Please use the diflucan if you get a yeast infection.    Cervical Strain and Sprain (Whiplash) with Rehab Cervical strain and sprain are injuries that commonly occur with "whiplash" injuries. Whiplash occurs when the neck is forcefully whipped backward or forward, such as during a motor vehicle accident or during contact sports. The muscles, ligaments, tendons, discs, and nerves of the neck are susceptible to injury when this occurs. RISK FACTORS Risk of having a whiplash injury increases if:  Osteoarthritis of the spine.  Situations that make head or neck accidents or trauma more likely.  High-risk sports (football, rugby, wrestling, hockey, auto racing, gymnastics, diving, contact karate, or boxing).  Poor strength and flexibility of the neck.  Previous neck injury.  Poor tackling technique.  Improperly fitted or padded equipment. SYMPTOMS   Pain or stiffness in the front or back of neck or both.  Symptoms may present immediately or up to 24 hours after injury.  Dizziness, headache, nausea, and vomiting.  Muscle spasm with soreness and stiffness in the neck.  Tenderness and swelling at the injury site. PREVENTION  Learn and use proper technique (avoid tackling with the head, spearing, and head-butting; use proper falling techniques to avoid landing on the head).  Warm up and stretch properly before activity.  Maintain physical fitness:  Strength, flexibility, and endurance.  Cardiovascular fitness.  Wear properly fitted and padded protective equipment, such as padded soft  collars, for participation in contact sports. PROGNOSIS  Recovery from cervical strain and sprain injuries is dependent on the extent of the injury. These injuries are usually curable in 1 week to 3 months with appropriate treatment.  RELATED COMPLICATIONS   Temporary numbness and weakness may occur if the nerve roots are damaged, and this may persist until the nerve has completely healed.  Chronic pain due to frequent recurrence of symptoms.  Prolonged healing, especially if activity is resumed too soon (before complete recovery). TREATMENT  Treatment initially involves the use of ice and medication to help reduce pain and inflammation. It is also important to perform strengthening and stretching exercises and modify activities that worsen symptoms so the injury does not get worse. These exercises may be performed at home or with a therapist. For patients who experience severe symptoms, a soft, padded collar may be recommended to be worn around the neck.  Improving your posture may help reduce symptoms. Posture improvement includes pulling your chin and abdomen in while sitting or standing. If you are sitting, sit in a firm chair with your buttocks against the back of the chair. While sleeping, try replacing your pillow with a small towel rolled to 2 inches in diameter, or use a cervical pillow or soft cervical collar. Poor sleeping positions delay healing.  For patients with nerve root damage, which causes numbness or weakness, the use of a cervical traction apparatus may be recommended. Surgery is rarely necessary for these injuries. However, cervical strain and sprains that are present at birth (congenital) may require surgery. MEDICATION   If pain medication is necessary, nonsteroidal anti-inflammatory medications, such as aspirin and ibuprofen, or other minor  pain relievers, such as acetaminophen, are often recommended.  Do not take pain medication for 7 days before surgery.  Prescription  pain relievers may be given if deemed necessary by your caregiver. Use only as directed and only as much as you need. HEAT AND COLD:   Cold treatment (icing) relieves pain and reduces inflammation. Cold treatment should be applied for 10 to 15 minutes every 2 to 3 hours for inflammation and pain and immediately after any activity that aggravates your symptoms. Use ice packs or an ice massage.  Heat treatment may be used prior to performing the stretching and strengthening activities prescribed by your caregiver, physical therapist, or athletic trainer. Use a heat pack or a warm soak. SEEK MEDICAL CARE IF:   Symptoms get worse or do not improve in 2 weeks despite treatment.  New, unexplained symptoms develop (drugs used in treatment may produce side effects). EXERCISES RANGE OF MOTION (ROM) AND STRETCHING EXERCISES - Cervical Strain and Sprain These exercises may help you when beginning to rehabilitate your injury. In order to successfully resolve your symptoms, you must improve your posture. These exercises are designed to help reduce the forward-head and rounded-shoulder posture which contributes to this condition. Your symptoms may resolve with or without further involvement from your physician, physical therapist or athletic trainer. While completing these exercises, remember:   Restoring tissue flexibility helps normal motion to return to the joints. This allows healthier, less painful movement and activity.  An effective stretch should be held for at least 20 seconds, although you may need to begin with shorter hold times for comfort.  A stretch should never be painful. You should only feel a gentle lengthening or release in the stretched tissue. STRETCH- Axial Extensors  Lie on your back on the floor. You may bend your knees for comfort. Place a rolled-up hand towel or dish towel, about 2 inches in diameter, under the part of your head that makes contact with the floor.  Gently tuck  your chin, as if trying to make a "double chin," until you feel a gentle stretch at the base of your head.  Hold __________ seconds. Repeat __________ times. Complete this exercise __________ times per day.  STRETCH - Axial Extension   Stand or sit on a firm surface. Assume a good posture: chest up, shoulders drawn back, abdominal muscles slightly tense, knees unlocked (if standing) and feet hip width apart.  Slowly retract your chin so your head slides back and your chin slightly lowers. Continue to look straight ahead.  You should feel a gentle stretch in the back of your head. Be certain not to feel an aggressive stretch since this can cause headaches later.  Hold for __________ seconds. Repeat __________ times. Complete this exercise __________ times per day. STRETCH - Cervical Side Bend   Stand or sit on a firm surface. Assume a good posture: chest up, shoulders drawn back, abdominal muscles slightly tense, knees unlocked (if standing) and feet hip width apart.  Without letting your nose or shoulders move, slowly tip your right / left ear to your shoulder until your feel a gentle stretch in the muscles on the opposite side of your neck.  Hold __________ seconds. Repeat __________ times. Complete this exercise __________ times per day. STRETCH - Cervical Rotators   Stand or sit on a firm surface. Assume a good posture: chest up, shoulders drawn back, abdominal muscles slightly tense, knees unlocked (if standing) and feet hip width apart.  Keeping your eyes level with  the ground, slowly turn your head until you feel a gentle stretch along the back and opposite side of your neck.  Hold __________ seconds. Repeat __________ times. Complete this exercise __________ times per day. RANGE OF MOTION - Neck Circles   Stand or sit on a firm surface. Assume a good posture: chest up, shoulders drawn back, abdominal muscles slightly tense, knees unlocked (if standing) and feet hip width  apart.  Gently roll your head down and around from the back of one shoulder to the back of the other. The motion should never be forced or painful.  Repeat the motion 10-20 times, or until you feel the neck muscles relax and loosen. Repeat __________ times. Complete the exercise __________ times per day. STRENGTHENING EXERCISES - Cervical Strain and Sprain These exercises may help you when beginning to rehabilitate your injury. They may resolve your symptoms with or without further involvement from your physician, physical therapist, or athletic trainer. While completing these exercises, remember:   Muscles can gain both the endurance and the strength needed for everyday activities through controlled exercises.  Complete these exercises as instructed by your physician, physical therapist, or athletic trainer. Progress the resistance and repetitions only as guided.  You may experience muscle soreness or fatigue, but the pain or discomfort you are trying to eliminate should never worsen during these exercises. If this pain does worsen, stop and make certain you are following the directions exactly. If the pain is still present after adjustments, discontinue the exercise until you can discuss the trouble with your clinician. STRENGTH - Cervical Flexors, Isometric  Face a wall, standing about 6 inches away. Place a small pillow, a ball about 6-8 inches in diameter, or a folded towel between your forehead and the wall.  Slightly tuck your chin and gently push your forehead into the soft object. Push only with mild to moderate intensity, building up tension gradually. Keep your jaw and forehead relaxed.  Hold 10 to 20 seconds. Keep your breathing relaxed.  Release the tension slowly. Relax your neck muscles completely before you start the next repetition. Repeat __________ times. Complete this exercise __________ times per day. STRENGTH- Cervical Lateral Flexors, Isometric   Stand about 6 inches  away from a wall. Place a small pillow, a ball about 6-8 inches in diameter, or a folded towel between the side of your head and the wall.  Slightly tuck your chin and gently tilt your head into the soft object. Push only with mild to moderate intensity, building up tension gradually. Keep your jaw and forehead relaxed.  Hold 10 to 20 seconds. Keep your breathing relaxed.  Release the tension slowly. Relax your neck muscles completely before you start the next repetition. Repeat __________ times. Complete this exercise __________ times per day. STRENGTH - Cervical Extensors, Isometric   Stand about 6 inches away from a wall. Place a small pillow, a ball about 6-8 inches in diameter, or a folded towel between the back of your head and the wall.  Slightly tuck your chin and gently tilt your head back into the soft object. Push only with mild to moderate intensity, building up tension gradually. Keep your jaw and forehead relaxed.  Hold 10 to 20 seconds. Keep your breathing relaxed.  Release the tension slowly. Relax your neck muscles completely before you start the next repetition. Repeat __________ times. Complete this exercise __________ times per day. POSTURE AND BODY MECHANICS CONSIDERATIONS - Cervical Strain and Sprain Keeping correct posture when sitting, standing or  completing your activities will reduce the stress put on different body tissues, allowing injured tissues a chance to heal and limiting painful experiences. The following are general guidelines for improved posture. Your physician or physical therapist will provide you with any instructions specific to your needs. While reading these guidelines, remember:  The exercises prescribed by your provider will help you have the flexibility and strength to maintain correct postures.  The correct posture provides the optimal environment for your joints to work. All of your joints have less wear and tear when properly supported by a  spine with good posture. This means you will experience a healthier, less painful body.  Correct posture must be practiced with all of your activities, especially prolonged sitting and standing. Correct posture is as important when doing repetitive low-stress activities (typing) as it is when doing a single heavy-load activity (lifting). PROLONGED STANDING WHILE SLIGHTLY LEANING FORWARD When completing a task that requires you to lean forward while standing in one place for a long time, place either foot up on a stationary 2- to 4-inch high object to help maintain the best posture. When both feet are on the ground, the low back tends to lose its slight inward curve. If this curve flattens (or becomes too large), then the back and your other joints will experience too much stress, fatigue more quickly, and can cause pain.  RESTING POSITIONS Consider which positions are most painful for you when choosing a resting position. If you have pain with flexion-based activities (sitting, bending, stooping, squatting), choose a position that allows you to rest in a less flexed posture. You would want to avoid curling into a fetal position on your side. If your pain worsens with extension-based activities (prolonged standing, working overhead), avoid resting in an extended position such as sleeping on your stomach. Most people will find more comfort when they rest with their spine in a more neutral position, neither too rounded nor too arched. Lying on a non-sagging bed on your side with a pillow between your knees, or on your back with a pillow under your knees will often provide some relief. Keep in mind, being in any one position for a prolonged period of time, no matter how correct your posture, can still lead to stiffness. WALKING Walk with an upright posture. Your ears, shoulders, and hips should all line up. OFFICE WORK When working at a desk, create an environment that supports good, upright posture. Without  extra support, muscles fatigue and lead to excessive strain on joints and other tissues. CHAIR:  A chair should be able to slide under your desk when your back makes contact with the back of the chair. This allows you to work closely.  The chair's height should allow your eyes to be level with the upper part of your monitor and your hands to be slightly lower than your elbows.  Body position:  Your feet should make contact with the floor. If this is not possible, use a foot rest.  Keep your ears over your shoulders. This will reduce stress on your neck and low back. Document Released: 08/21/2005 Document Revised: 01/05/2014 Document Reviewed: 12/03/2008 [facility] Patient Information 2015 [place], [place]. This information is not intended to replace advice given to you by your health care provider. Make sure you discuss any questions you have with your health care provider.  Impingement Syndrome, Rotator Cuff, Bursitis with Rehab Impingement syndrome is a condition that involves inflammation of the tendons of the rotator cuff and the subacromial  bursa, that causes pain in the shoulder. The rotator cuff consists of four tendons and muscles that control much of the shoulder and upper arm function. The subacromial bursa is a fluid filled sac that helps reduce friction between the rotator cuff and one of the bones of the shoulder (acromion). Impingement syndrome is usually an overuse injury that causes swelling of the bursa (bursitis), swelling of the tendon (tendonitis), and/or a tear of the tendon (strain). Strains are classified into three categories. Grade 1 strains cause pain, but the tendon is not lengthened. Grade 2 strains include a lengthened ligament, due to the ligament being stretched or partially ruptured. With grade 2 strains there is still function, although the function may be decreased. Grade 3 strains include a complete tear of the tendon or muscle, and function is usually  impaired. SYMPTOMS   Pain around the shoulder, often at the outer portion of the upper arm.  Pain that gets worse with shoulder function, especially when reaching overhead or lifting.  Sometimes, aching when not using the arm.  Pain that wakes you up at night.  Sometimes, tenderness, swelling, warmth, or redness over the affected area.  Loss of strength.  Limited motion of the shoulder, especially reaching behind the back (to the back pocket or to unhook bra) or across your body.  Crackling sound (crepitation) when moving the arm.  Biceps tendon pain and inflammation (in the front of the shoulder). Worse when bending the elbow or lifting. CAUSES  Impingement syndrome is often an overuse injury, in which chronic (repetitive) motions cause the tendons or bursa to become inflamed. A strain occurs when a force is paced on the tendon or muscle that is greater than it can withstand. Common mechanisms of injury include: Stress from sudden increase in duration, frequency, or intensity of training.  Direct hit (trauma) to the shoulder.  Aging, erosion of the tendon with normal use.  Bony bump on shoulder (acromial spur). RISK INCREASES WITH:  Contact sports (football, wrestling, boxing).  Throwing sports (baseball, tennis, volleyball).  Weightlifting and bodybuilding.  Heavy labor.  Previous injury to the rotator cuff, including impingement.  Poor shoulder strength and flexibility.  Failure to warm up properly before activity.  Inadequate protective equipment.  Old age.  Bony bump on shoulder (acromial spur). PREVENTION   Warm up and stretch properly before activity.  Allow for adequate recovery between workouts.  Maintain physical fitness:  Strength, flexibility, and endurance.  Cardiovascular fitness.  Learn and use proper exercise technique. PROGNOSIS  If treated properly, impingement syndrome usually goes away within 6 weeks. Sometimes surgery is required.   RELATED COMPLICATIONS   Longer healing time if not properly treated, or if not given enough time to heal.  Recurring symptoms, that result in a chronic condition.  Shoulder stiffness, frozen shoulder, or loss of motion.  Rotator cuff tendon tear.  Recurring symptoms, especially if activity is resumed too soon, with overuse, with a direct blow, or when using poor technique. TREATMENT  Treatment first involves the use of ice and medicine, to reduce pain and inflammation. The use of strengthening and stretching exercises may help reduce pain with activity. These exercises may be performed at home or with a therapist. If non-surgical treatment is unsuccessful after more than 6 months, surgery may be advised. After surgery and rehabilitation, activity is usually possible in 3 months.  MEDICATION  If pain medicine is needed, nonsteroidal anti-inflammatory medicines (aspirin and ibuprofen), or other minor pain relievers (acetaminophen), are often advised.  Do not take pain medicine for 7 days before surgery.  Prescription pain relievers may be given, if your caregiver thinks they are needed. Use only as directed and only as much as you need.  Corticosteroid injections may be given by your caregiver. These injections should be reserved for the most serious cases, because they may only be given a certain number of times. HEAT AND COLD  Cold treatment (icing) should be applied for 10 to 15 minutes every 2 to 3 hours for inflammation and pain, and immediately after activity that aggravates your symptoms. Use ice packs or an ice massage.  Heat treatment may be used before performing stretching and strengthening activities prescribed by your caregiver, physical therapist, or athletic trainer. Use a heat pack or a warm water soak. SEEK MEDICAL CARE IF:   Symptoms get worse or do not improve in 4 to 6 weeks, despite treatment.  New, unexplained symptoms develop. (Drugs used in treatment may produce  side effects.) EXERCISES  RANGE OF MOTION (ROM) AND STRETCHING EXERCISES - Impingement Syndrome (Rotator Cuff  Tendinitis, Bursitis) These exercises may help you when beginning to rehabilitate your injury. Your symptoms may go away with or without further involvement from your physician, physical therapist or athletic trainer. While completing these exercises, remember:   Restoring tissue flexibility helps normal motion to return to the joints. This allows healthier, less painful movement and activity.  An effective stretch should be held for at least 30 seconds.  A stretch should never be painful. You should only feel a gentle lengthening or release in the stretched tissue. STRETCH - Flexion, Standing  Stand with good posture. With an underhand grip on your right / left hand, and an overhand grip on the opposite hand, grasp a broomstick or cane so that your hands are a little more than shoulder width apart.  Keeping your right / left elbow straight and shoulder muscles relaxed, push the stick with your opposite hand, to raise your right / left arm in front of your body and then overhead. Raise your arm until you feel a stretch in your right / left shoulder, but before you have increased shoulder pain.  Try to avoid shrugging your right / left shoulder as your arm rises, by keeping your shoulder blade tucked down and toward your mid-back spine. Hold for __________ seconds.  Slowly return to the starting position. Repeat __________ times. Complete this exercise __________ times per day. STRETCH - Abduction, Supine  Lie on your back. With an underhand grip on your right / left hand and an overhand grip on the opposite hand, grasp a broomstick or cane so that your hands are a little more than shoulder width apart.  Keeping your right / left elbow straight and your shoulder muscles relaxed, push the stick with your opposite hand, to raise your right / left arm out to the side of your body and  then overhead. Raise your arm until you feel a stretch in your right / left shoulder, but before you have increased shoulder pain.  Try to avoid shrugging your right / left shoulder as your arm rises, by keeping your shoulder blade tucked down and toward your mid-back spine. Hold for __________ seconds.  Slowly return to the starting position. Repeat __________ times. Complete this exercise __________ times per day. ROM - Flexion, Active-Assisted  Lie on your back. You may bend your knees for comfort.  Grasp a broomstick or cane so your hands are about shoulder width apart. Your right /  left hand should grip the end of the stick, so that your hand is positioned "thumbs-up," as if you were about to shake hands.  Using your healthy arm to lead, raise your right / left arm overhead, until you feel a gentle stretch in your shoulder. Hold for __________ seconds.  Use the stick to assist in returning your right / left arm to its starting position. Repeat __________ times. Complete this exercise __________ times per day.  ROM - Internal Rotation, Supine   Lie on your back on a firm surface. Place your right / left elbow about 60 degrees away from your side. Elevate your elbow with a folded towel, so that the elbow and shoulder are the same height.  Using a broomstick or cane and your strong arm, pull your right / left hand toward your body until you feel a gentle stretch, but no increase in your shoulder pain. Keep your shoulder and elbow in place throughout the exercise.  Hold for __________ seconds. Slowly return to the starting position. Repeat __________ times. Complete this exercise __________ times per day. STRETCH - Internal Rotation  Place your right / left hand behind your back, palm up.  Throw a towel or belt over your opposite shoulder. Grasp the towel with your right / left hand.  While keeping an upright posture, gently pull up on the towel, until you feel a stretch in the front  of your right / left shoulder.  Avoid shrugging your right / left shoulder as your arm rises, by keeping your shoulder blade tucked down and toward your mid-back spine.  Hold for __________ seconds. Release the stretch, by lowering your healthy hand. Repeat __________ times. Complete this exercise __________ times per day. ROM - Internal Rotation   Using an underhand grip, grasp a stick behind your back with both hands.  While standing upright with good posture, slide the stick up your back until you feel a mild stretch in the front of your shoulder.  Hold for __________ seconds. Slowly return to your starting position. Repeat __________ times. Complete this exercise __________ times per day.  STRETCH - Posterior Shoulder Capsule   Stand or sit with good posture. Grasp your right / left elbow and draw it across your chest, keeping it at the same height as your shoulder.  Pull your elbow, so your upper arm comes in closer to your chest. Pull until you feel a gentle stretch in the back of your shoulder.  Hold for __________ seconds. Repeat __________ times. Complete this exercise __________ times per day. STRENGTHENING EXERCISES - Impingement Syndrome (Rotator Cuff Tendinitis, Bursitis) These exercises may help you when beginning to rehabilitate your injury. They may resolve your symptoms with or without further involvement from your physician, physical therapist or athletic trainer. While completing these exercises, remember:  Muscles can gain both the endurance and the strength needed for everyday activities through controlled exercises.  Complete these exercises as instructed by your physician, physical therapist or athletic trainer. Increase the resistance and repetitions only as guided.  You may experience muscle soreness or fatigue, but the pain or discomfort you are trying to eliminate should never worsen during these exercises. If this pain does get worse, stop and make sure you  are following the directions exactly. If the pain is still present after adjustments, discontinue the exercise until you can discuss the trouble with your clinician.  During your recovery, avoid activity or exercises which involve actions that place your injured hand or elbow above  your head or behind your back or head. These positions stress the tissues which you are trying to heal. STRENGTH - Scapular Depression and Adduction   With good posture, sit on a firm chair. Support your arms in front of you, with pillows, arm rests, or on a table top. Have your elbows in line with the sides of your body.  Gently draw your shoulder blades down and toward your mid-back spine. Gradually increase the tension, without tensing the muscles along the top of your shoulders and the back of your neck.  Hold for __________ seconds. Slowly release the tension and relax your muscles completely before starting the next repetition.  After you have practiced this exercise, remove the arm support and complete the exercise in standing as well as sitting position. Repeat __________ times. Complete this exercise __________ times per day.  STRENGTH - Shoulder Abductors, Isometric  With good posture, stand or sit about 4-6 inches from a wall, with your right / left side facing the wall.  Bend your right / left elbow. Gently press your right / left elbow into the wall. Increase the pressure gradually, until you are pressing as hard as you can, without shrugging your shoulder or increasing any shoulder discomfort.  Hold for __________ seconds.  Release the tension slowly. Relax your shoulder muscles completely before you begin the next repetition. Repeat __________ times. Complete this exercise __________ times per day.  STRENGTH - External Rotators, Isometric  Keep your right / left elbow at your side and bend it 90 degrees.  Step into a door frame so that the outside of your right / left wrist can press against the  door frame without your upper arm leaving your side.  Gently press your right / left wrist into the door frame, as if you were trying to swing the back of your hand away from your stomach. Gradually increase the tension, until you are pressing as hard as you can, without shrugging your shoulder or increasing any shoulder discomfort.  Hold for __________ seconds.  Release the tension slowly. Relax your shoulder muscles completely before you begin the next repetition. Repeat __________ times. Complete this exercise __________ times per day.  STRENGTH - Supraspinatus   Stand or sit with good posture. Grasp a __________ weight, or an exercise band or tubing, so that your hand is "thumbs-up," like you are shaking hands.  Slowly lift your right / left arm in a "V" away from your thigh, diagonally into the space between your side and straight ahead. Lift your hand to shoulder height or as far as you can, without increasing any shoulder pain. At first, many people do not lift their hands above shoulder height.  Avoid shrugging your right / left shoulder as your arm rises, by keeping your shoulder blade tucked down and toward your mid-back spine.  Hold for __________ seconds. Control the descent of your hand, as you slowly return to your starting position. Repeat __________ times. Complete this exercise __________ times per day.  STRENGTH - External Rotators  Secure a rubber exercise band or tubing to a fixed object (table, pole) so that it is at the same height as your right / left elbow when you are standing or sitting on a firm surface.  Stand or sit so that the secured exercise band is at your uninjured side.  Bend your right / left elbow 90 degrees. Place a folded towel or small pillow under your right / left arm, so that your elbow is  a few inches away from your side.  Keeping the tension on the exercise band, pull it away from your body, as if pivoting on your elbow. Be sure to keep your body  steady, so that the movement is coming only from your rotating shoulder.  Hold for __________ seconds. Release the tension in a controlled manner, as you return to the starting position. Repeat __________ times. Complete this exercise __________ times per day.  STRENGTH - Internal Rotators   Secure a rubber exercise band or tubing to a fixed object (table, pole) so that it is at the same height as your right / left elbow when you are standing or sitting on a firm surface.  Stand or sit so that the secured exercise band is at your right / left side.  Bend your elbow 90 degrees. Place a folded towel or small pillow under your right / left arm so that your elbow is a few inches away from your side.  Keeping the tension on the exercise band, pull it across your body, toward your stomach. Be sure to keep your body steady, so that the movement is coming only from your rotating shoulder.  Hold for __________ seconds. Release the tension in a controlled manner, as you return to the starting position. Repeat __________ times. Complete this exercise __________ times per day.  STRENGTH - Scapular Protractors, Standing   Stand arms length away from a wall. Place your hands on the wall, keeping your elbows straight.  Begin by dropping your shoulder blades down and toward your mid-back spine.  To strengthen your protractors, keep your shoulder blades down, but slide them forward on your rib cage. It will feel as if you are lifting the back of your rib cage away from the wall. This is a subtle motion and can be challenging to complete. Ask your caregiver for further instruction, if you are not sure you are doing the exercise correctly.  Hold for __________ seconds. Slowly return to the starting position, resting the muscles completely before starting the next repetition. Repeat __________ times. Complete this exercise __________ times per day. STRENGTH - Scapular Protractors, Supine  Lie on your back on  a firm surface. Extend your right / left arm straight into the air while holding a __________ weight in your hand.  Keeping your head and back in place, lift your shoulder off the floor.  Hold for __________ seconds. Slowly return to the starting position, and allow your muscles to relax completely before starting the next repetition. Repeat __________ times. Complete this exercise __________ times per day. STRENGTH - Scapular Protractors, Quadruped  Get onto your hands and knees, with your shoulders directly over your hands (or as close as you can be, comfortably).  Keeping your elbows locked, lift the back of your rib cage up into your shoulder blades, so your mid-back rounds out. Keep your neck muscles relaxed.  Hold this position for __________ seconds. Slowly return to the starting position and allow your muscles to relax completely before starting the next repetition. Repeat __________ times. Complete this exercise __________ times per day.  STRENGTH - Scapular Retractors  Secure a rubber exercise band or tubing to a fixed object (table, pole), so that it is at the height of your shoulders when you are either standing, or sitting on a firm armless chair.  With a palm down grip, grasp an end of the band in each hand. Straighten your elbows and lift your hands straight in front of you, at  shoulder height. Step back, away from the secured end of the band, until it becomes tense.  Squeezing your shoulder blades together, draw your elbows back toward your sides, as you bend them. Keep your upper arms lifted away from your body throughout the exercise.  Hold for __________ seconds. Slowly ease the tension on the band, as you reverse the directions and return to the starting position. Repeat __________ times. Complete this exercise __________ times per day. STRENGTH - Shoulder Extensors   Secure a rubber exercise band or tubing to a fixed object (table, pole) so that it is at the height of  your shoulders when you are either standing, or sitting on a firm armless chair.  With a thumbs-up grip, grasp an end of the band in each hand. Straighten your elbows and lift your hands straight in front of you, at shoulder height. Step back, away from the secured end of the band, until it becomes tense.  Squeezing your shoulder blades together, pull your hands down to the sides of your thighs. Do not allow your hands to go behind you.  Hold for __________ seconds. Slowly ease the tension on the band, as you reverse the directions and return to the starting position. Repeat __________ times. Complete this exercise __________ times per day.  STRENGTH - Scapular Retractors and External Rotators   Secure a rubber exercise band or tubing to a fixed object (table, pole) so that it is at the height as your shoulders, when you are either standing, or sitting on a firm armless chair.  With a palm down grip, grasp an end of the band in each hand. Bend your elbows 90 degrees and lift your elbows to shoulder height, at your sides. Step back, away from the secured end of the band, until it becomes tense.  Squeezing your shoulder blades together, rotate your shoulders so that your upper arms and elbows remain stationary, but your fists travel upward to head height.  Hold for __________ seconds. Slowly ease the tension on the band, as you reverse the directions and return to the starting position. Repeat __________ times. Complete this exercise __________ times per day.  STRENGTH - Scapular Retractors and External Rotators, Rowing   Secure a rubber exercise band or tubing to a fixed object (table, pole) so that it is at the height of your shoulders, when you are either standing, or sitting on a firm armless chair.  With a palm down grip, grasp an end of the band in each hand. Straighten your elbows and lift your hands straight in front of you, at shoulder height. Step back, away from the secured end of the  band, until it becomes tense.  Step 1: Squeeze your shoulder blades together. Bending your elbows, draw your hands to your chest, as if you are rowing a boat. At the end of this motion, your hands and elbow should be at shoulder height and your elbows should be out to your sides.  Step 2: Rotate your shoulders, to raise your hands above your head. Your forearms should be vertical and your upper arms should be horizontal.  Hold for __________ seconds. Slowly ease the tension on the band, as you reverse the directions and return to the starting position. Repeat __________ times. Complete this exercise __________ times per day.  STRENGTH - Scapular Depressors  Find a sturdy chair without wheels, such as a dining room chair.  Keeping your feet on the floor, and your hands on the chair arms, lift your  bottom up from the seat, and lock your elbows.  Keeping your elbows straight, allow gravity to pull your body weight down. Your shoulders will rise toward your ears.  Raise your body against gravity by drawing your shoulder blades down your back, shortening the distance between your shoulders and ears. Although your feet should always maintain contact with the floor, your feet should progressively support less body weight, as you get stronger.  Hold for __________ seconds. In a controlled and slow manner, lower your body weight to begin the next repetition. Repeat __________ times. Complete this exercise __________ times per day.  Document Released: 08/21/2005 Document Revised: 11/13/2011 Document Reviewed: 12/03/2008 [facility] Patient Information 2015 [place], [place]. This information is not intended to replace advice given to you by your health care provider. Make sure you discuss any questions you have with your health care provider.

## 2014-07-27 NOTE — ED Provider Notes (Signed)
CSN: [id number]     Arrival date & time 07/27/14  [id number] History   First MD Initiated Contact with Patient 07/27/14 1041     Chief Complaint  Patient presents with  . Cough  . Nasal Congestion  . Otalgia    bilateral  . Shoulder Injury    right, med tech, lifting pt's   (Consider location/radiation/quality/duration/timing/severity/associated sxs/prior Treatment) HPI  Developed cough and congestion 9 days ago. Now w/ fever. Husband w/ similar symptoms. Associated w/ sinus congestion and pain, sore throat. Tessalon perles. W/o benefit. Tylenol adn ibuprofen w/ some benefit.   R shoulder injured while at work on 8 days ago while lifting a patent at work. Constant. From neck down shoulder and arm. Not sure of exactly which movement started pain. No pop or swelling. Ibuprofen and tylenol w/ some improvement  History reviewed. No pertinent past medical history. Past Surgical History  Procedure Laterality Date  . Cesarean section    . Cesarean section N/A 11/14/2012    Procedure: CESAREAN SECTION;  Surgeon: Zelphia CairoGretchen Adkins, MD;  Location: [facility];  Service: Obstetrics;  Laterality: N/A;  Repeat   Family History  Problem Relation Age of Onset  . Other Neg Hx   . Hypertension Father   . Diabetes Father   . Hypertension Maternal Grandmother   . Hypertension Maternal Grandfather   . Hypertension Paternal Grandmother   . Hypertension Paternal Grandfather    History  Substance Use Topics  . Smoking status: Never Smoker   . Smokeless tobacco: Not on file  . Alcohol Use: No   OB History    Gravida Para Term Preterm AB TAB SAB Ectopic Multiple Living   4 2 1 1 2  0 2 0 0 2     Review of Systems Per HPI with all other pertinent systems negative.   Allergies  Review of patient's allergies indicates no known allergies.  Home Medications   Prior to Admission medications   Medication Sig Start Date End Date Taking? Authorizing Provider  acetaminophen (TYLENOL) 325 MG tablet Take 650  mg by mouth every 6 (six) hours as needed for pain. headache    Historical Provider, MD  amoxicillin-clavulanate (AUGMENTIN) 875-125 MG per tablet Take 1 tablet by mouth 2 (two) times daily. 07/27/14   Ozella Rocksavid J Merrell, MD  flintstones complete (FLINTSTONES) 60 MG chewable tablet Chew 2 tablets by mouth daily.    Historical Provider, MD  fluconazole (DIFLUCAN) 150 MG tablet Take 1 tablet (150 mg total) by mouth daily. Repeat dose in 3 days 07/27/14   Ozella Rocksavid J Merrell, MD  fluticasone [facility](FLONASE) 50 MCG/ACT nasal spray Place 2 sprays into both nostrils at bedtime. 07/27/14   Ozella Rocksavid J Merrell, MD  HYDROmorphone (DILAUDID) 2 MG tablet Take 1 tablet (2 mg total) by mouth every 4 (four) hours as needed for pain. 11/16/12   Juluis MireJohn S McComb, MD  ipratropium (ATROVENT) 0.06 % nasal spray Place 2 sprays into both nostrils 4 (four) times daily. 07/27/14   Ozella Rocksavid J Merrell, MD  meloxicam (MOBIC) 15 MG tablet Take 0.5-1 tablets (7.5-15 mg total) by mouth daily. 07/27/14   Ozella Rocksavid J Merrell, MD  methocarbamol (ROBAXIN) 500 MG tablet Take 1-2 tablets (500-1,000 mg total) by mouth every 6 (six) hours as needed for muscle spasms. 07/27/14   Ozella Rocksavid J Merrell, MD  pantoprazole (PROTONIX) 40 MG tablet Take 1 tablet (40 mg total) by mouth daily. 05/29/12   Wilmer FloorLisa A Leftwich-Kirby, CNM  promethazine (PHENERGAN) 25 MG tablet Take 25 mg  by mouth every 6 (six) hours as needed. For nausea    Historical Provider, MD   BP 115/78 mmHg  Pulse 86  Temp(Src) 99.4 F (37.4 C) (Oral)  Resp 16  SpO2 96% Physical Exam  Constitutional: She is oriented to person, place, and time. She appears well-developed and well-nourished.  HENT:  Head: Normocephalic and atraumatic.  Nasal speech Sinus congestion on palpation TM w/ clear effusion bilat  Eyes: EOM are normal. Pupils are equal, round, and reactive to light.  Cardiovascular: Normal rate.   No murmur heard. Pulmonary/Chest: Effort normal and breath sounds normal. No respiratory distress.   Abdominal: Soft. She exhibits no distension.  Musculoskeletal:  R arm ROM limited to 90 degrees abduction and flexion due to pain. Hawkins minimally + on R. Unable to do behind the head or behind the back. Spurlings + bilat  Neurological: She is alert and oriented to person, place, and time.  Skin: She is not diaphoretic.  Psychiatric: Her behavior is normal. Thought content normal.    ED Course  Procedures (including critical care time) Labs Review Labs Reviewed - No data to display  Imaging Review No results found.   MDM   1. Acute frontal sinusitis, recurrence not specified   2. Cervical radiculopathy   3. Rotator cuff tendonitis, right    AUgmentin Flonase, atrovent (nasal) Continue tessalion perles Neck exercises and rotator cuff exercises Meloxicam ICE and rest.  Robaxin F/u PRN Precautions given and all questions answered  Shelly Flattenavid Merrell, MD Family Medicine 07/27/2014, 12:38 PM      Ozella Rocksavid J Merrell, MD 07/27/14 [phone]

## 2015-07-08 DIAGNOSIS — R002 Palpitations: Secondary | ICD-10-CM | POA: Insufficient documentation

## 2015-07-08 DIAGNOSIS — R000 Tachycardia, unspecified: Secondary | ICD-10-CM | POA: Insufficient documentation

## 2015-12-08 ENCOUNTER — Encounter (HOSPITAL_COMMUNITY): Payer: Self-pay | Admitting: Emergency Medicine

## 2015-12-08 ENCOUNTER — Ambulatory Visit (HOSPITAL_COMMUNITY)
Admission: EM | Admit: 2015-12-08 | Discharge: 2015-12-08 | Disposition: A | Discharge status: Nursing Home (Includes ICF) | Payer: PRIVATE HEALTH INSURANCE | Attending: Family Medicine | Admitting: Family Medicine

## 2015-12-08 DIAGNOSIS — R509 Fever, unspecified: Secondary | ICD-10-CM

## 2015-12-08 DIAGNOSIS — R197 Diarrhea, unspecified: Secondary | ICD-10-CM

## 2015-12-08 DIAGNOSIS — R1011 Right upper quadrant pain: Secondary | ICD-10-CM | POA: Diagnosis not present

## 2015-12-08 DIAGNOSIS — R112 Nausea with vomiting, unspecified: Secondary | ICD-10-CM | POA: Diagnosis not present

## 2015-12-08 LAB — POCT URINALYSIS DIP (DEVICE)
Glucose, UA: NEGATIVE mg/dL
Hgb urine dipstick: NEGATIVE
Leukocytes, UA: NEGATIVE
NITRITE: NEGATIVE
Protein, ur: NEGATIVE mg/dL
Specific Gravity, Urine: >=1.03 (ref 1.005–1.030)
UROBILINOGEN UA: 0.2 mg/dL (ref 0.0–1.0)
pH: 5.5 (ref 5.0–8.0)

## 2015-12-08 LAB — POCT PREGNANCY, URINE: PREG TEST UR: NEGATIVE

## 2015-12-08 NOTE — ED Notes (Signed)
C/o intermittent RUQ abd pain onset x2 weeks associated w/n/v/d Taking OTC imodium and pepto-bismol w/no relief.  A&O x4.... No acute distress.

## 2015-12-08 NOTE — ED Provider Notes (Addendum)
CSN: [id number]     Arrival date & time 12/08/15  1324 History   First MD Initiated Contact with Patient 12/08/15 1516     Chief Complaint  Patient presents with  . Abdominal Pain   (Consider location/radiation/quality/duration/timing/severity/associated sxs/prior Treatment) Patient is a 30 y.o. female presenting with abdominal pain. The history is provided by the patient. No language interpreter was used.  Abdominal Pain Associated symptoms: chills, diarrhea, fatigue, fever, nausea and vomiting   Associated symptoms: no chest pain, no cough, no dysuria, no hematuria, no shortness of breath, no sore throat, no vaginal bleeding and no vaginal discharge    Patient presents with complaint of 2 weeks of intermittent abd pain (R flank), nausea and vomiting, watery non-bloody diarrhea. She has also had intermittent fevers and chills, measured temp 101F yesterday.  She has had a markedly decreased appetite. More N/V/D when she tries to eat. Ate a [place] sandwich last night and spent the evenign with vomiting and diarrhea. Has not attempted to eat today.  No cough or congestion.  She has no sick contacts.    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   SUrgical Hx; s/p appy one year ago.  S/p C-section. Has Mirena in place, last menses before Mirena (3 years ago).  History reviewed. No pertinent past medical history. Past Surgical History  Procedure Laterality Date  . Cesarean section    . Cesarean section N/A 11/14/2012    Procedure: CESAREAN SECTION;  Surgeon: Zelphia CairoGretchen Adkins, MD;  Location: [facility];  Service: Obstetrics;  Laterality: N/A;  Repeat   Family History  Problem Relation Age of Onset  . Other Neg Hx   . Hypertension Father   . Diabetes Father   . Hypertension Maternal Grandmother   . Hypertension Maternal Grandfather   . Hypertension Paternal Grandmother   . Hypertension Paternal Grandfather    Social History  Substance Use Topics  . Smoking status: Never Smoker   . Smokeless tobacco:  None  . Alcohol Use: No   OB History    Gravida Para Term Preterm AB TAB SAB Ectopic Multiple Living   4 2 1 1 2  0 2 0 0 2     Review of Systems  Constitutional: Positive for fever, chills, diaphoresis, appetite change and fatigue.  HENT: Negative for congestion, ear pain, postnasal drip and sore throat.   Respiratory: Negative for cough, chest tightness and shortness of breath.   Cardiovascular: Negative for chest pain.  Gastrointestinal: Positive for nausea, vomiting, abdominal pain and diarrhea. Negative for blood in stool and abdominal distention.  Genitourinary: Positive for flank pain. Negative for dysuria, urgency, frequency, hematuria, decreased urine volume, vaginal bleeding, vaginal discharge, difficulty urinating, vaginal pain, menstrual problem and pelvic pain.    Allergies  Review of patient's allergies indicates no known allergies.  Home Medications   Prior to Admission medications   Medication Sig Start Date End Date Taking? Authorizing Provider  acetaminophen (TYLENOL) 325 MG tablet Take 650 mg by mouth every 6 (six) hours as needed for pain. headache    Historical Provider, MD  amoxicillin-clavulanate (AUGMENTIN) 875-125 MG per tablet Take 1 tablet by mouth 2 (two) times daily. 07/27/14   Ozella Rocksavid J Merrell, MD  flintstones complete (FLINTSTONES) 60 MG chewable tablet Chew 2 tablets by mouth daily.    Historical Provider, MD  fluconazole (DIFLUCAN) 150 MG tablet Take 1 tablet (150 mg total) by mouth daily. Repeat dose in 3 days 07/27/14   Ozella Rocksavid J Merrell, MD  fluticasone [facility](FLONASE) 50 MCG/ACT  nasal spray Place 2 sprays into both nostrils at bedtime. 07/27/14   Ozella Rocks, MD  HYDROmorphone (DILAUDID) 2 MG tablet Take 1 tablet (2 mg total) by mouth every 4 (four) hours as needed for pain. 11/16/12   Richardean Chimera, MD  ipratropium (ATROVENT) 0.06 % nasal spray Place 2 sprays into both nostrils 4 (four) times daily. 07/27/14   Ozella Rocks, MD  meloxicam (MOBIC) 15 MG  tablet Take 0.5-1 tablets (7.5-15 mg total) by mouth daily. 07/27/14   Ozella Rocks, MD  methocarbamol (ROBAXIN) 500 MG tablet Take 1-2 tablets (500-1,000 mg total) by mouth every 6 (six) hours as needed for muscle spasms. 07/27/14   Ozella Rocks, MD  pantoprazole (PROTONIX) 40 MG tablet Take 1 tablet (40 mg total) by mouth daily. 05/29/12   Misty Stanley A Leftwich-Kirby, CNM  promethazine (PHENERGAN) 25 MG tablet Take 25 mg by mouth every 6 (six) hours as needed. For nausea    Historical Provider, MD   Meds Ordered and Administered this Visit  Medications - No data to display  BP 132/91 mmHg  Pulse 80  Temp(Src) 98.4 F (36.9 C) (Oral)  Resp 18  SpO2 100% No data found.   Physical Exam  Constitutional: She appears well-developed and well-nourished. No distress.  HENT:  Head: Normocephalic.  Right Ear: External ear normal.  Left Ear: External ear normal.  Nose: Nose normal.  Mouth/Throat: No oropharyngeal exudate.  Mildly dry mucus membranes Clear oropharynx. No exudates.   No frontal or maxillary sinus tenderness  Eyes: Conjunctivae are normal. Right eye exhibits no discharge. Left eye exhibits no discharge.  Neck: Normal range of motion. Neck supple.  Cardiovascular: Normal rate, regular rhythm and normal heart sounds.   No murmur heard. Pulmonary/Chest: Effort normal and breath sounds normal. No respiratory distress. She has no wheezes. She has no rales. She exhibits no tenderness.  Abdominal: Soft. She exhibits no distension and no mass. There is tenderness. There is no rebound and no guarding.  RUQ tenderness, equivocal Murphys sign No suprapubic tenderness  Lymphadenopathy:    She has no cervical adenopathy.  Skin: She is not diaphoretic.    ED Course  Procedures (including critical care time)  Labs Review Labs Reviewed - No data to display  Imaging Review No results found.   Visual Acuity Review  Right Eye Distance:   Left Eye Distance:   Bilateral  Distance:    Right Eye Near:   Left Eye Near:    Bilateral Near:         MDM   1. RUQ abdominal pain   2. Fever and chills   3. Nausea and vomiting, vomiting of unspecified type   4. Diarrhea, unspecified type    Patient with 2 weeks of N/V/D, inability to take without diarrhea/vomiting, and now with RUQ tenderness. No urinary sxs. Differential Dx includes CCY, renal colic, UTI/pyelonephritis, much less likely PID or adnexal issue. Amenorrheic since Mirena placed, doubt pregnancy.  UA dipstick in [facility]: positive for ketones, negative blood/nitrites.    Concern for possible CCY vs nephrolithiasis; patient is mildly dehydrated as well, intolerant of orals at home.  Plan for transfer to ED for further evaluation, IVF.  Paula Compton, MD    Barbaraann Barthel, MD 12/08/15 7829  Barbaraann Barthel, MD 12/08/15 1600

## 2015-12-09 ENCOUNTER — Encounter (HOSPITAL_COMMUNITY): Payer: Self-pay | Admitting: *Deleted

## 2015-12-09 ENCOUNTER — Telehealth (HOSPITAL_COMMUNITY): Payer: Self-pay | Admitting: Family Medicine

## 2015-12-09 ENCOUNTER — Emergency Department (HOSPITAL_COMMUNITY)
Admission: EM | Admit: 2015-12-09 | Discharge: 2015-12-09 | Disposition: A | Discharge status: Home / Self Care | Payer: PRIVATE HEALTH INSURANCE | Attending: Emergency Medicine | Admitting: Emergency Medicine

## 2015-12-09 ENCOUNTER — Emergency Department (HOSPITAL_COMMUNITY): Payer: PRIVATE HEALTH INSURANCE

## 2015-12-09 DIAGNOSIS — Z793 Long term (current) use of hormonal contraceptives: Secondary | ICD-10-CM | POA: Insufficient documentation

## 2015-12-09 DIAGNOSIS — R1011 Right upper quadrant pain: Secondary | ICD-10-CM | POA: Diagnosis not present

## 2015-12-09 DIAGNOSIS — R197 Diarrhea, unspecified: Secondary | ICD-10-CM | POA: Diagnosis not present

## 2015-12-09 DIAGNOSIS — Z3202 Encounter for pregnancy test, result negative: Secondary | ICD-10-CM | POA: Diagnosis not present

## 2015-12-09 DIAGNOSIS — R112 Nausea with vomiting, unspecified: Secondary | ICD-10-CM

## 2015-12-09 LAB — URINE MICROSCOPIC-ADD ON: RBC / HPF: NONE SEEN RBC/hpf (ref 0–5)

## 2015-12-09 LAB — CBC
HCT: 42.1 % (ref 36.0–46.0)
Hemoglobin: 14.5 g/dL (ref 12.0–15.0)
MCH: 29.3 pg (ref 26.0–34.0)
MCHC: 34.4 g/dL (ref 30.0–36.0)
MCV: 85.1 fL (ref 78.0–100.0)
Platelets: 317 10*3/uL (ref 150–400)
RBC: 4.95 MIL/uL (ref 3.87–5.11)
RDW: 12.6 % (ref 11.5–15.5)
WBC: 6.3 10*3/uL (ref 4.0–10.5)

## 2015-12-09 LAB — COMPREHENSIVE METABOLIC PANEL
ALT: 25 U/L (ref 14–54)
ANION GAP: 14 (ref 5–15)
AST: 22 U/L (ref 15–41)
Albumin: 4.2 g/dL (ref 3.5–5.0)
Alkaline Phosphatase: 70 U/L (ref 38–126)
BUN: 9 mg/dL (ref 6–20)
CHLORIDE: 104 mmol/L (ref 101–111)
CO2: 22 mmol/L (ref 22–32)
Calcium: 9.4 mg/dL (ref 8.9–10.3)
Creatinine, Ser: 0.72 mg/dL (ref 0.44–1.00)
GFR calc non Af Amer: >60 mL/min (ref >60)
Glucose, Bld: 98 mg/dL (ref 65–99)
Potassium: 3.7 mmol/L (ref 3.5–5.1)
SODIUM: 140 mmol/L (ref 135–145)
Total Bilirubin: 0.6 mg/dL (ref 0.3–1.2)
Total Protein: 7.6 g/dL (ref 6.5–8.1)

## 2015-12-09 LAB — URINALYSIS, ROUTINE W REFLEX MICROSCOPIC
GLUCOSE, UA: NEGATIVE mg/dL
HGB URINE DIPSTICK: NEGATIVE
Ketones, ur: NEGATIVE mg/dL
Nitrite: NEGATIVE
Protein, ur: NEGATIVE mg/dL
Specific Gravity, Urine: 1.028 (ref 1.005–1.030)
pH: 5.5 (ref 5.0–8.0)

## 2015-12-09 LAB — LIPASE, BLOOD: LIPASE: 26 U/L (ref 11–51)

## 2015-12-09 LAB — POC URINE PREG, ED: PREG TEST UR: NEGATIVE

## 2015-12-09 MED ORDER — ONDANSETRON 4 MG PO TBDP: 4.0000 mg | ORAL_TABLET | Freq: Three times a day (TID) | ORAL | Status: AC | PRN

## 2015-12-09 MED ORDER — MORPHINE SULFATE (PF) 4 MG/ML IV SOLN
4.0000 mg | Freq: Once | INTRAVENOUS | Status: AC
  Administered 2015-12-09: 4 mg via INTRAVENOUS
  Filled 2015-12-09: qty 1

## 2015-12-09 MED ORDER — KETOROLAC TROMETHAMINE 30 MG/ML IJ SOLN
30.0000 mg | Freq: Once | INTRAMUSCULAR | Status: AC
  Administered 2015-12-09: 30 mg via INTRAVENOUS
  Filled 2015-12-09: qty 1

## 2015-12-09 MED ORDER — ONDANSETRON HCL 4 MG/2ML IJ SOLN
4.0000 mg | INTRAMUSCULAR | Status: AC
  Administered 2015-12-09: 4 mg via INTRAVENOUS
  Filled 2015-12-09: qty 2

## 2015-12-09 MED ORDER — OMEPRAZOLE 20 MG PO CPDR: 20.0000 mg | DELAYED_RELEASE_CAPSULE | Freq: Every day | ORAL | Status: AC

## 2015-12-09 MED ORDER — SODIUM CHLORIDE 0.9 % IV BOLUS (SEPSIS)
1000.0000 mL | Freq: Once | INTRAVENOUS | Status: AC
  Administered 2015-12-09: 1000 mL via INTRAVENOUS

## 2015-12-09 MED ORDER — ONDANSETRON HCL 4 MG/2ML IJ SOLN
4.0000 mg | Freq: Once | INTRAMUSCULAR | Status: AC
  Administered 2015-12-09: 4 mg via INTRAVENOUS
  Filled 2015-12-09: qty 2

## 2015-12-09 NOTE — ED Notes (Signed)
Patient able to dress and ambulate independently 

## 2015-12-09 NOTE — ED Notes (Signed)
Patient transported to Ultrasound 

## 2015-12-09 NOTE — ED Provider Notes (Signed)
CSN: [id number]     Arrival date & time 12/09/15  0920 History   First MD Initiated Contact with Patient 12/09/15 1012     No chief complaint on file.   Kelsey Rogers is a 30 y.o. female who presents to the ED complaining of right upper quadrant abdominal pain for the past 2 weeks. She also reports intermittent nausea, vomiting and diarrhea. Patient reports her pain in her right upper quadrant is worse after eating. She reports a constant dull pain for the past 2 weeks. She currently complains of 5 out of 10 right upper quadrant abdominal pain. Patient reports she last had diarrhea yesterday. She reports diarrhea after eating. Patient last vomited yesterday. Patient has had a previous appendectomy and C-section. No treatments prior to arrival today. She denies fevers, hematemesis, hematochezia, urinary symptoms, vaginal bleeding, vaginal discharge, rashes.  The history is provided by the patient. No language interpreter was used.    History reviewed. No pertinent past medical history. Past Surgical History  Procedure Laterality Date  . Cesarean section    . Cesarean section N/A 11/14/2012    Procedure: CESAREAN SECTION;  Surgeon: Zelphia Cairo, MD;  Location: [facility];  Service: Obstetrics;  Laterality: N/A;  Repeat  . Appendectomy     Family History  Problem Relation Age of Onset  . Other Neg Hx   . Hypertension Father   . Diabetes Father   . Hypertension Maternal Grandmother   . Hypertension Maternal Grandfather   . Hypertension Paternal Grandmother   . Hypertension Paternal Grandfather    Social History  Substance Use Topics  . Smoking status: Never Smoker   . Smokeless tobacco: None  . Alcohol Use: No   OB History    Gravida Para Term Preterm AB TAB SAB Ectopic Multiple Living   0 2 0 0 2     Review of Systems  Constitutional: Negative for fever and chills.  HENT: Negative for congestion and sore throat.   Eyes: Negative for visual disturbance.  Respiratory:  Negative for cough and shortness of breath.   Cardiovascular: Negative for chest pain.  Gastrointestinal: Positive for nausea, vomiting, abdominal pain and diarrhea. Negative for blood in stool.  Genitourinary: Negative for dysuria, urgency, hematuria, vaginal bleeding, vaginal discharge and difficulty urinating.  Musculoskeletal: Negative for back pain and neck pain.  Skin: Negative for rash.  Neurological: Negative for headaches.      Allergies  Review of patient's allergies indicates no known allergies.  Home Medications   Prior to Admission medications   Medication Sig Start Date End Date Taking? Authorizing Provider  levonorgestrel (MIRENA) 20 MCG/24HR IUD 1 Intra Uterine Device by Intrauterine route once.   Yes Historical Provider, MD  acetaminophen (TYLENOL) 325 MG tablet Take 650 mg by mouth every 6 (six) hours as needed for fever or headache.     Historical Provider, MD  omeprazole (PRILOSEC) 20 MG capsule Take 1 capsule (20 mg total) by mouth daily. 12/09/15   Everlene Farrier, PA-C  ondansetron (ZOFRAN ODT) 4 MG disintegrating tablet Take 1 tablet (4 mg total) by mouth every 8 (eight) hours as needed for nausea or vomiting. 12/09/15   Everlene Farrier, PA-C   BP 106/62 mmHg  Pulse 87  Temp(Src) 98.1 F (36.7 C) (Oral)  Resp 16  Ht 5' (1.524 m)  Wt 88.055 kg  BMI 37.91 kg/m2  SpO2 99% Physical Exam  Constitutional: She appears well-developed and well-nourished. No distress.  Nontoxic appearing.  HENT:  Head: Normocephalic and atraumatic.  Mouth/Throat: Oropharynx is clear and moist.  Eyes: Conjunctivae are normal. Pupils are equal, round, and reactive to light. Right eye exhibits no discharge. Left eye exhibits no discharge.  Neck: Neck supple.  Cardiovascular: Normal rate, regular rhythm, normal heart sounds and intact distal pulses.  Exam reveals no gallop and no friction rub.   No murmur heard. Pulmonary/Chest: Effort normal and breath sounds normal. No respiratory  distress. She has no wheezes. She has no rales.  Abdominal: Soft. Bowel sounds are normal. She exhibits no distension and no mass. There is tenderness. There is guarding. There is no rebound.  Abdomen is soft. Bowel sounds are present. Patient has right upper quadrant tenderness to palpation positive Murphy sign. No CVA or flank tenderness. No peritoneal signs.  Musculoskeletal: She exhibits no edema.  Lymphadenopathy:    She has no cervical adenopathy.  Neurological: She is alert. Coordination normal.  Skin: Skin is warm and dry. No rash noted. She is not diaphoretic. No erythema. No pallor.  Psychiatric: She has a normal mood and affect. Her behavior is normal.  Nursing note and vitals reviewed.   ED Course  Procedures (including critical care time) Labs Review Labs Reviewed  URINALYSIS, ROUTINE W REFLEX MICROSCOPIC (NOT AT [facility]) - Abnormal; Notable for the following:    Color, Urine AMBER (*)    APPearance CLOUDY (*)    Bilirubin Urine SMALL (*)    Leukocytes, UA TRACE (*)    All other components within normal limits  URINE MICROSCOPIC-ADD ON - Abnormal; Notable for the following:    Squamous Epithelial / LPF 6-30 (*)    Bacteria, UA FEW (*)    Crystals CA OXALATE CRYSTALS (*)    All other components within normal limits  LIPASE, BLOOD  COMPREHENSIVE METABOLIC PANEL  CBC  POC URINE PREG, ED    Imaging Review US Abdomen Limited Ruq  12/09/2015  CLINICAL DATA:  Right upper quadrant pain for 2 weeks EXAM: US ABDOMEN LIMITED - RIGHT UPPER QUADRANT COMPARISON:  None. FINDINGS: Gallbladder: Well distended without evidence of cholelithiasis. A small gallbladder polyp is seen. No gallbladder wall thickening is noted. Common bile duct: Diameter: 2 mm. Liver: No focal lesion identified. Within normal limits in parenchymal echogenicity. IMPRESSION: Small gallbladder polyp.  No acute abnormality is seen. Electronically Signed   By: Alcide Clever M.D.   On: 12/09/2015 12:52   I have  personally reviewed and evaluated these images and lab results as part of my medical decision-making.   EKG Interpretation None      Filed Vitals:   12/09/15 1115 12/09/15 1217 12/09/15 1330 12/09/15 1428  BP: 111/76 93/76 115/64 106/62  Pulse: 78 67 81 87  Temp:      TempSrc:      Resp:    16  Height:      Weight:      SpO2: 98% 100% 99% 99%     MDM   Meds given in ED:  Medications  sodium chloride 0.9 % bolus 1,000 mL (0 mLs Intravenous Stopped 12/09/15 1428)  ondansetron (ZOFRAN) injection 4 mg (4 mg Intravenous Given 12/09/15 1217)  morphine 4 MG/ML injection 4 mg (4 mg Intravenous Given 12/09/15 1219)  ondansetron (ZOFRAN) injection 4 mg (4 mg Intravenous Given 12/09/15 1540)  ketorolac (TORADOL) 30 MG/ML injection 30 mg (30 mg Intravenous Given 12/09/15 1540)    New Prescriptions   OMEPRAZOLE (PRILOSEC) 20 MG CAPSULE    Take 1 capsule (20  mg total) by mouth daily.   ONDANSETRON (ZOFRAN ODT) 4 MG DISINTEGRATING TABLET    Take 1 tablet (4 mg total) by mouth every 8 (eight) hours as needed for nausea or vomiting.    Final diagnoses:  RUQ abdominal pain  Nausea vomiting and diarrhea   This  is a 30 y.o. female who presents to the ED complaining of right upper quadrant abdominal pain for the past 2 weeks. She also reports intermittent nausea, vomiting and diarrhea. Patient reports her pain in her right upper quadrant is worse after eating. She reports a constant dull pain for the past 2 weeks. She currently complains of 5 out of 10 right upper quadrant abdominal pain. Patient reports she last had diarrhea yesterday. She reports diarrhea after eating. Patient last vomited yesterday. On exam the patient is afebrile nontoxic appearing. Her abdomen is soft and she has right upper quadrant tenderness to palpation.  Urine pregnancy is negative. Urinalysis is nitrate negative with trace leukocytes. 0-5 white blood cells. Patient has no urinary symptoms. Lipase is within normal limits. CMP is  within normal limits. No elevated liver enzymes. CBC is within normal limits. Right upper quadrant ultrasound indicates a small gallbladder polyp and no acute abnormality. Patient with likely gallbladder colic. Patient reports feeling better after Zofran and pain control. Patient is tolerating by mouth prior to discharge. Will discharge her prescriptions for omeprazole and Zofran. Educated on low fat diet.  Patient declined additional pain medication for at home. Will have her follow up with general surgeon Dr. Dwain SarnaWakefield for further evaluation.  I advised the patient to follow-up with their primary care provider this week. I advised the patient to return to the emergency department with new or worsening symptoms or new concerns. The patient verbalized understanding and agreement with plan.      Everlene FarrierWilliam Trachelle Low, PA-C 12/09/15 1609  Benjiman CoreNathan Pickering, MD 12/10/15 [phone]

## 2015-12-09 NOTE — ED Notes (Signed)
Pt c/o R abd pain onset x 2 wks, pt reports being seen @ [facility] with recommendation to have eval of gallbladder, pt reports n/v/d onset x 2 wks, pt reports x 2 vomiting & x 4 liquid stools in the last 24 hrs, pt c/o nausea, pt A&O x4

## 2015-12-09 NOTE — Discharge Instructions (Signed)
Biliary Colic °Biliary colic is a pain in the upper abdomen. The pain: °· Is usually felt on the right side of the abdomen, but it may also be felt in the center of the abdomen, just below the breastbone (sternum). °· May spread back toward the right shoulder blade. °· May be steady or irregular. °· May be accompanied by nausea and vomiting. °Most of the time, the pain goes away in 1-5 hours. After the most intense pain passes, the abdomen may continue to ache mildly for about 24 hours. °Biliary colic is caused by a blockage in the bile duct. The bile duct is a pathway that carries bile--a liquid that helps to digest fats--from the gallbladder to the small intestine. Biliary colic usually occurs after eating, when the digestive system demands bile. The pain develops when muscle cells contract forcefully to try to move the blockage so that bile can get by. °HOME CARE INSTRUCTIONS °· Take medicines only as directed by your health care provider. °· Drink enough fluid to keep your urine clear or pale yellow. °· Avoid fatty, greasy, and fried foods. These kinds of foods increase your body's demand for bile. °· Avoid any foods that make your pain worse. °· Avoid overeating. °· Avoid having a large meal after fasting. °SEEK MEDICAL CARE IF: °· You develop a fever. °· Your pain gets worse. °· You vomit. °· You develop nausea that prevents you from eating and drinking. °SEEK IMMEDIATE MEDICAL CARE IF: °· You suddenly develop a fever and shaking chills. °· You develop a yellowish discoloration (jaundice) of: °¨ Skin. °¨ Whites of the eyes. °¨ Mucous membranes. °· You have continuous or severe pain that is not relieved with medicines. °· You have nausea and vomiting that is not relieved with medicines. °· You develop dizziness or you faint. °  °This information is not intended to replace advice given to you by your health care provider. Make sure you discuss any questions you have with your health care provider. °  °Document  Released: 01/22/2006 Document Revised: 01/05/2015 Document Reviewed: 06/02/2014 °Elsevier Interactive Patient Education ©2016 [employer] ° ° °Low-Fat Diet for Pancreatitis or Gallbladder Conditions °A low-fat diet can be helpful if you have pancreatitis or a gallbladder condition. With these conditions, your pancreas and gallbladder have trouble digesting fats. A healthy eating plan with less fat will help rest your pancreas and gallbladder and reduce your symptoms. °WHAT DO I NEED TO KNOW ABOUT THIS DIET? °· Eat a low-fat diet. °¨ Reduce your fat intake to less than 20-30% of your total daily calories. This is less than 50-60 g of fat per day. °¨ Remember that you need some fat in your diet. Ask your dietician what your daily goal should be. °¨ Choose nonfat and low-fat healthy foods. Look for the words "nonfat," "low fat," or "fat free." °¨ As a guide, look on the label and choose foods with less than 3 g of fat per serving. Eat only one serving. °· Avoid alcohol. °· Do not smoke. If you need help quitting, talk with your health care provider. °· Eat small frequent meals instead of three large heavy meals. °WHAT FOODS CAN I EAT? °Grains °Include healthy grains and starches such as potatoes, wheat bread, fiber-rich cereal, and brown rice. Choose whole grain options whenever possible. In adults, whole grains should account for 45-65% of your daily calories.  °Fruits and Vegetables °Eat plenty of fruits and vegetables. Fresh fruits and vegetables add fiber to your diet. °Meats and   Other Protein Sources °Eat lean meat such as chicken and pork. Trim any fat off of meat before cooking it. Eggs, fish, and beans are other sources of protein. In adults, these foods should account for 10-35% of your daily calories. °Dairy °Choose low-fat milk and dairy options. Dairy includes fat and protein, as well as calcium.  °Fats and Oils °Limit high-fat foods such as fried foods, sweets, baked goods, sugary drinks.  °Other °Creamy  sauces and condiments, such as mayonnaise, can add extra fat. Think about whether or not you need to use them, or use smaller amounts or low fat options. °WHAT FOODS ARE NOT RECOMMENDED? °· High fat foods, such as: °¨ Baked goods. °¨ Ice cream. °¨ French toast. °¨ Sweet rolls. °¨ Pizza. °¨ Cheese bread. °¨ Foods covered with batter, butter, creamy sauces, or cheese. °¨ Fried foods. °¨ Sugary drinks and desserts. °· Foods that cause gas or bloating °  °This information is not intended to replace advice given to you by your health care provider. Make sure you discuss any questions you have with your health care provider. °  °Document Released: 08/26/2013 Document Reviewed: 08/26/2013 °Elsevier Interactive Patient Education ©2016 [employer] Inc[employer] ° °

## 2015-12-09 NOTE — Telephone Encounter (Signed)
[facility] to [facility] ED for concern of RUQ flank pain with n/v.  Upon reviewing chart this morning, it is noticed that patient does not have any notes/labs from ED last night.  I called patient to inquire about her welfare, she reports continued flank pain and difficulty taking po.  She reports that she left the ED last night without being seen due to the long wait time. Tried to go to work this morning but couldn't.  I emphasized that she is recommended to have evaluation in ED for her abd pain and dehydration/lack of ability to tolerate po.    Paula ComptonJames Pamila Mendibles, MD

## 2017-04-19 DIAGNOSIS — H6993 Unspecified Eustachian tube disorder, bilateral: Secondary | ICD-10-CM | POA: Insufficient documentation

## 2017-04-19 DIAGNOSIS — H6983 Other specified disorders of Eustachian tube, bilateral: Secondary | ICD-10-CM | POA: Insufficient documentation

## 2017-08-14 IMAGING — US US ABDOMEN LIMITED
1 series · 14 of 25 positions shown · non-contrast
Comparison: None.

CLINICAL DATA: Right upper quadrant pain for 2 weeks

EXAM:
US ABDOMEN LIMITED - RIGHT UPPER QUADRANT

[Series 1: us abdomen limited · 0.19mm/px · 14 of 53 slices shown]
[im 1/53]
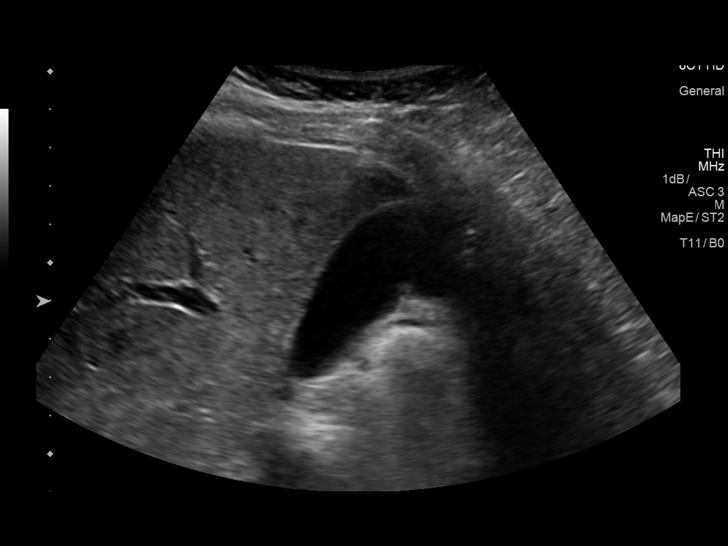
[im 5/53]
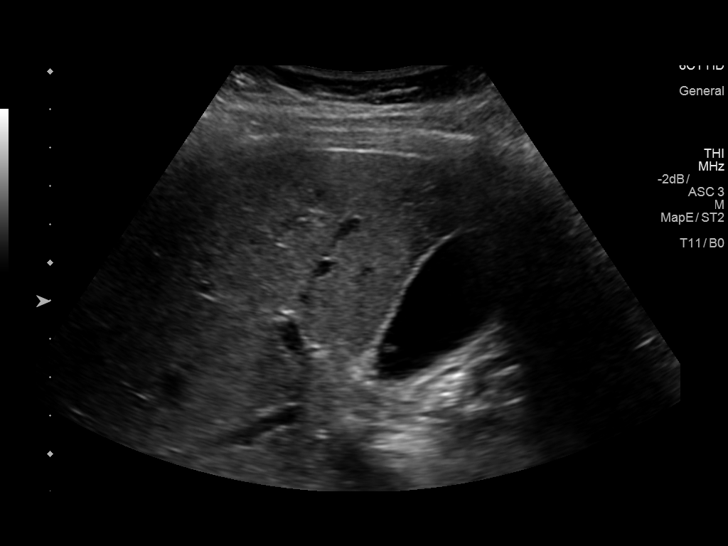
[im 9/53]
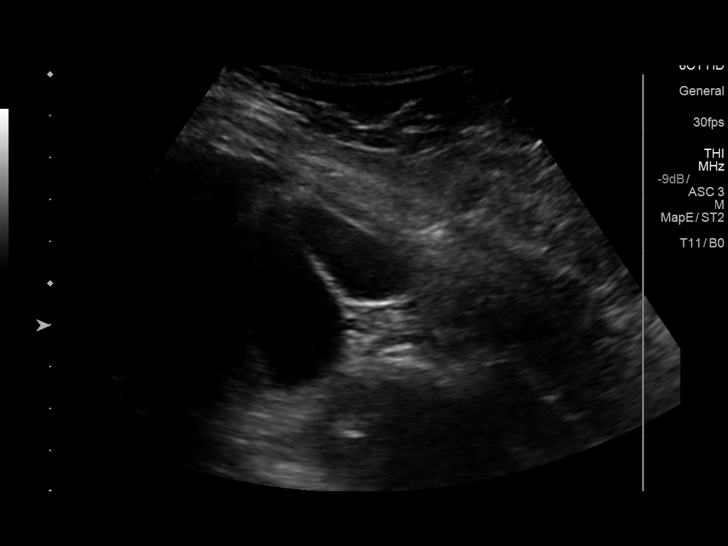
[im 14/53]
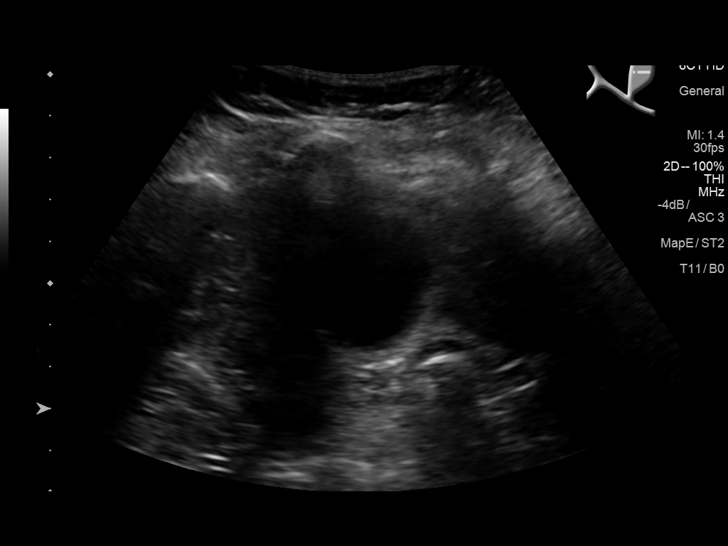
[im 18/53]
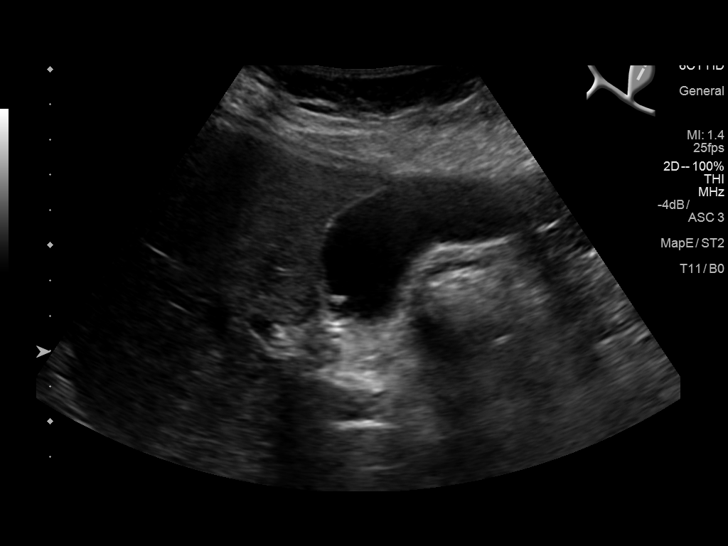
[im 20/53]
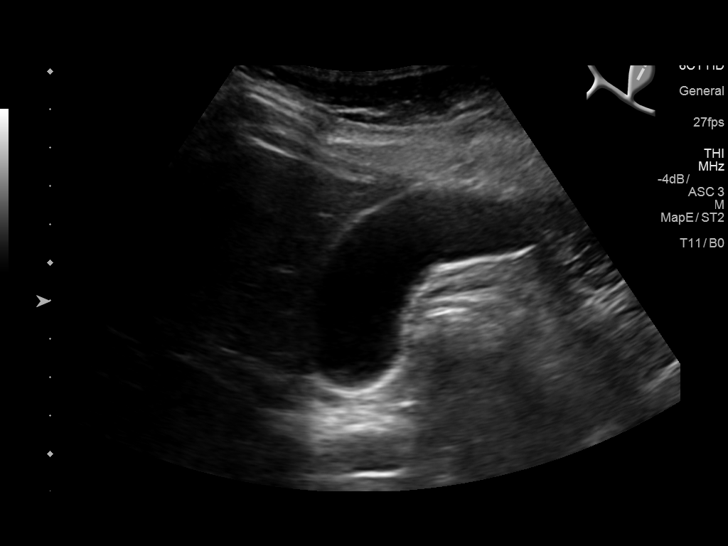
[im 24/53]
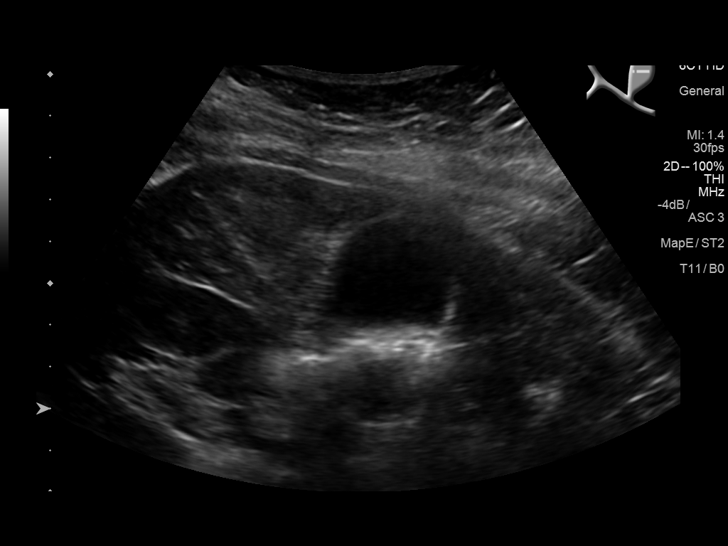
[im 29/53]
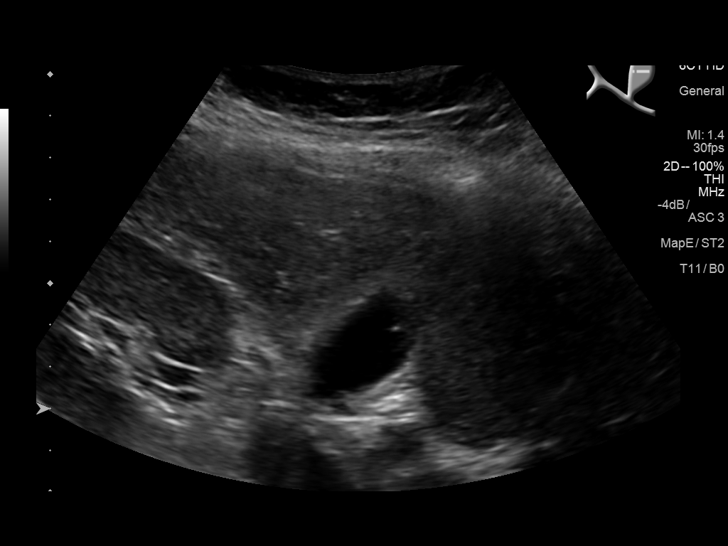
[im 33/53]
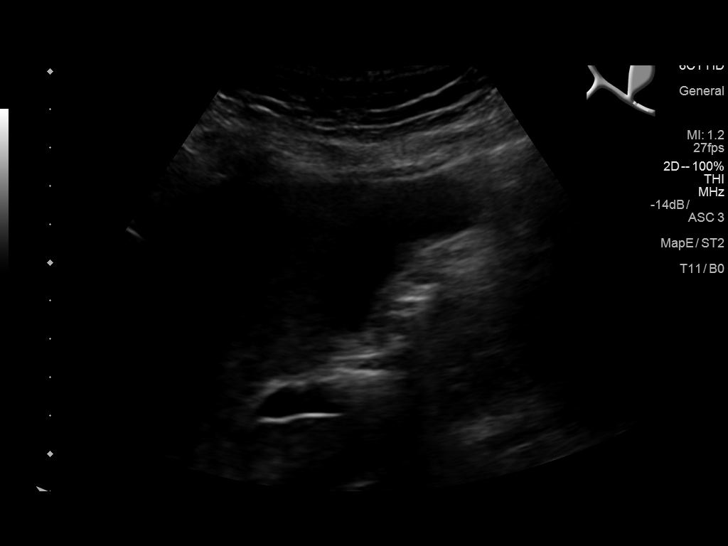
[im 35/53]
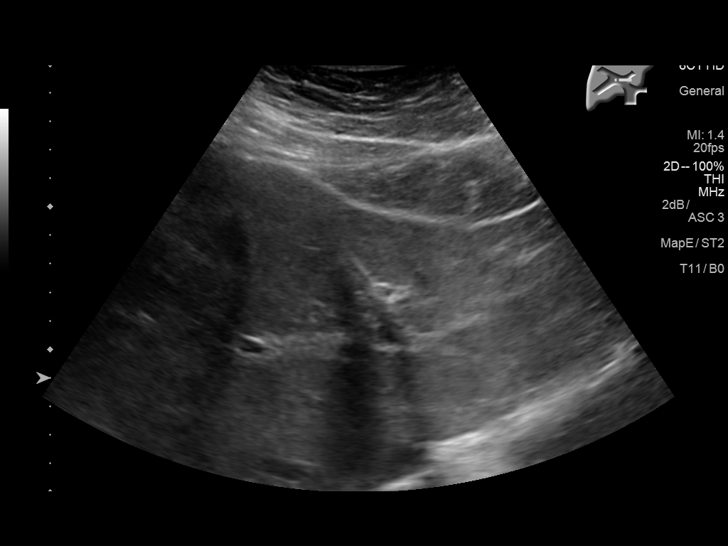
[im 40/53]
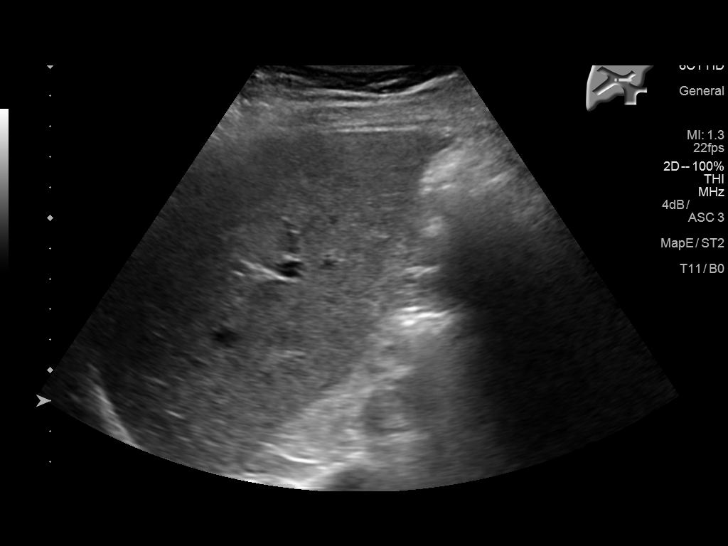
[im 44/53]
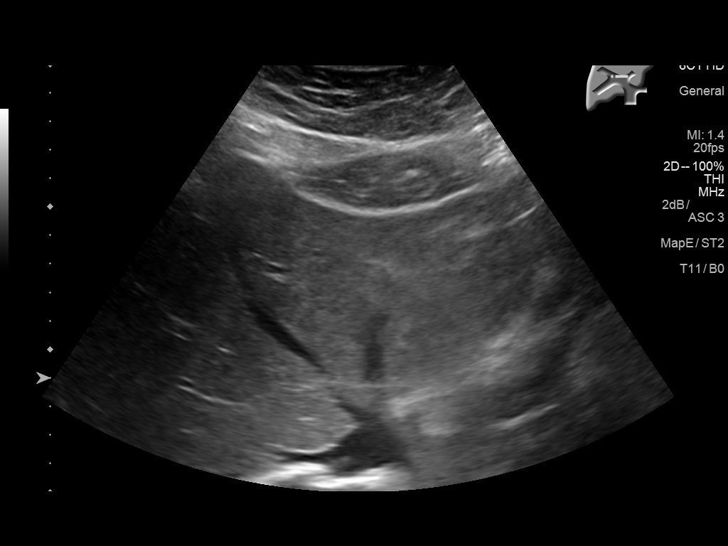
[im 48/53]
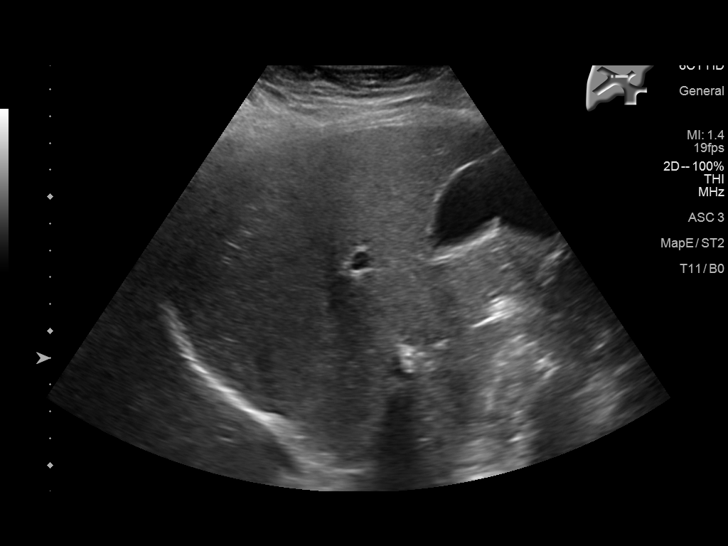
[im 53/53]
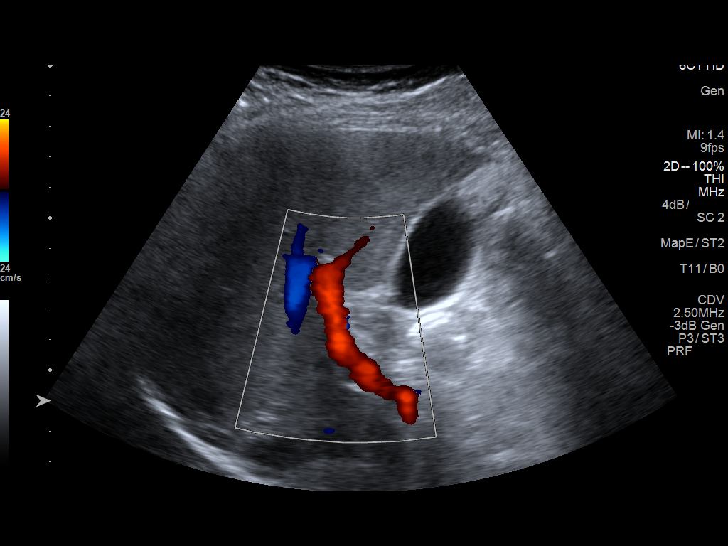

[14 of 25 positions shown; findings below may reference images not displayed]

FINDINGS: Gallbladder:

Well distended without evidence of cholelithiasis. A small
gallbladder polyp is seen. No gallbladder wall thickening is noted.

Common bile duct:

Diameter: 2 mm.

Liver:

No focal lesion identified. Within normal limits in parenchymal
echogenicity.
IMPRESSION: Small gallbladder polyp.  No acute abnormality is seen.

## 2017-08-31 ENCOUNTER — Ambulatory Visit: Payer: Self-pay | Admitting: Emergency Medicine

## 2017-08-31 VITALS — BP 90/70 | HR 90 | Temp 97.9°F | Resp 16 | Wt 180.2 lb

## 2017-08-31 DIAGNOSIS — J029 Acute pharyngitis, unspecified: Secondary | ICD-10-CM

## 2017-08-31 DIAGNOSIS — J069 Acute upper respiratory infection, unspecified: Secondary | ICD-10-CM

## 2017-08-31 DIAGNOSIS — B9789 Other viral agents as the cause of diseases classified elsewhere: Secondary | ICD-10-CM

## 2017-08-31 LAB — POCT RAPID STREP A (OFFICE): Rapid Strep A Screen: NEGATIVE

## 2017-08-31 MED ORDER — PREDNISONE 50 MG PO TABS: ORAL_TABLET | ORAL | 0 refills | Status: AC

## 2017-08-31 MED ORDER — MAGIC MOUTHWASH W/LIDOCAINE: 10.0000 mL | Freq: Three times a day (TID) | ORAL | 0 refills | Status: AC | PRN

## 2017-08-31 MED ORDER — GUAIFENESIN-CODEINE 100-10 MG/5ML PO SOLN: 5.0000 mL | Freq: Four times a day (QID) | ORAL | 0 refills | Status: AC | PRN

## 2017-08-31 NOTE — Progress Notes (Signed)
Kelsey Rogers is a 31 y.o. female who presents for evaluation of URI like symptoms.  Symptoms include achiness, congestion, enlarged tonsils, fever: fevers up to 101 degrees, nasal congestion, non productive cough, pain while swallowing, sneezing and swollen glands.  Onset of symptoms was 4 days ago, and has been unchanged since that time.  Treatment to date:  acetaminophen and decongestants. 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 High risk factors for influenza complications:  none.  The following portions of the patient's history were reviewed and updated as appropriate:  allergies, current medications and past medical history.  Review of Systems  Constitutional: Positive for chills, fever and malaise/fatigue.  HENT: Positive for congestion, ear pain and sore throat.   Respiratory: Positive for cough. Negative for sputum production and wheezing.   Cardiovascular: Negative for chest pain and palpitations.  Gastrointestinal: Negative for diarrhea, nausea and vomiting.  Genitourinary: Negative.   Musculoskeletal: Negative.   Skin: Negative for itching and rash.  Neurological: Negative.     Objective:   Vitals:   08/31/17 1254  BP: 90/70  Pulse: 90  Resp: 16  Temp: 97.9 F (36.6 C)  SpO2: 98%    Physical Exam  Constitutional: She is well-developed, well-nourished, and in no distress. No distress.  HENT:  Head: Normocephalic.  Right Ear: Tympanic membrane and external ear normal.  Left Ear: Tympanic membrane and external ear normal.  Nose: Nose normal. Right sinus exhibits no maxillary sinus tenderness and no frontal sinus tenderness. Left sinus exhibits no maxillary sinus tenderness and no frontal sinus tenderness.  Mouth/Throat: Oropharynx is clear and moist. No oropharyngeal exudate.  Eyes: Conjunctivae are normal.  Neck: Normal range of motion. Neck supple.  Cardiovascular: Normal rate and regular rhythm.  Pulmonary/Chest:  Effort normal and breath sounds normal.  Abdominal: Soft.  Lymphadenopathy:    She has cervical adenopathy.  Skin: She is not diaphoretic.  Nursing note and vitals reviewed.    Assessment:   1. Sore throat   2. Viral URI with cough       Plan:     [facility] Criteria   no :Exudative Tonsillitis  yes :Presence of Cough yes :Hx of Fever yes :Tender cervical lymph adenopathy no :Age less than 14 (+1) or over 44 (-1)  Total 2/4, RSS (-)     1. Sore throat  - POCT rapid strep A - magic mouthwash w/lidocaine SOLN; Take 10 mLs by mouth 3 (three) times daily as needed for mouth pain.  Dispense: 120 mL; Refill: 0  2. Viral URI with cough OTC medicines for symptom relief, follow up as needed. - guaiFENesin-codeine 100-10 MG/5ML syrup; Take 5 mLs by mouth every 6 (six) hours as needed for cough.  Dispense: 120 mL; Refill: 0 - predniSONE (DELTASONE) 50 MG tablet; Take 1 tablet daily with food  Dispense: 5 tablet; Refill: 0

## 2017-08-31 NOTE — Patient Instructions (Signed)
